# Patient Record
Sex: Female | Born: 1975 | Race: Black or African American | Hispanic: No | Smoking: Never smoker
Health system: Southern US, Community
[De-identification: ages and names within clinical notes are randomized; demographics above are authoritative.]

## PROBLEM LIST (undated history)

## (undated) ENCOUNTER — Emergency Department (HOSPITAL_COMMUNITY): Admission: EM | Payer: Federal, State, Local not specified - PPO

## (undated) ENCOUNTER — Ambulatory Visit: Disposition: A | Discharge status: Home / Self Care | Payer: Federal, State, Local not specified - PPO

## (undated) DIAGNOSIS — K5792 Diverticulitis of intestine, part unspecified, without perforation or abscess without bleeding: Secondary | ICD-10-CM

## (undated) DIAGNOSIS — K5900 Constipation, unspecified: Secondary | ICD-10-CM

## (undated) DIAGNOSIS — R7303 Prediabetes: Secondary | ICD-10-CM

## (undated) DIAGNOSIS — E669 Obesity, unspecified: Secondary | ICD-10-CM

## (undated) DIAGNOSIS — G4733 Obstructive sleep apnea (adult) (pediatric): Secondary | ICD-10-CM

## (undated) DIAGNOSIS — K219 Gastro-esophageal reflux disease without esophagitis: Secondary | ICD-10-CM

## (undated) DIAGNOSIS — G9332 Myalgic encephalomyelitis/chronic fatigue syndrome: Secondary | ICD-10-CM

## (undated) DIAGNOSIS — E559 Vitamin D deficiency, unspecified: Secondary | ICD-10-CM

## (undated) DIAGNOSIS — K829 Disease of gallbladder, unspecified: Secondary | ICD-10-CM

## (undated) DIAGNOSIS — I10 Essential (primary) hypertension: Secondary | ICD-10-CM

## (undated) DIAGNOSIS — H6120 Impacted cerumen, unspecified ear: Secondary | ICD-10-CM

## (undated) DIAGNOSIS — K589 Irritable bowel syndrome without diarrhea: Secondary | ICD-10-CM

## (undated) HISTORY — DX: Prediabetes: R73.03

## (undated) HISTORY — DX: Impacted cerumen, unspecified ear: H61.20

## (undated) HISTORY — DX: Irritable bowel syndrome, unspecified: K58.9

## (undated) HISTORY — DX: Disease of gallbladder, unspecified: K82.9

## (undated) HISTORY — DX: Essential (primary) hypertension: I10

## (undated) HISTORY — DX: Obesity, unspecified: E66.9

## (undated) HISTORY — PX: ABDOMINAL HYSTERECTOMY: SHX81

## (undated) HISTORY — PX: TUBAL LIGATION: SHX77

## (undated) HISTORY — DX: Diverticulitis of intestine, part unspecified, without perforation or abscess without bleeding: K57.92

## (undated) HISTORY — DX: Gastro-esophageal reflux disease without esophagitis: K21.9

## (undated) HISTORY — DX: Obstructive sleep apnea (adult) (pediatric): G47.33

## (undated) HISTORY — DX: Myalgic encephalomyelitis/chronic fatigue syndrome: G93.32

## (undated) HISTORY — DX: Vitamin D deficiency, unspecified: E55.9

## (undated) HISTORY — PX: ABLATION ON ENDOMETRIOSIS: SHX5787

## (undated) HISTORY — DX: Constipation, unspecified: K59.00

## (undated) HISTORY — PX: CHOLECYSTECTOMY: SHX55

## (undated) HISTORY — PX: WISDOM TOOTH EXTRACTION: SHX21

---
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
## 2014-04-26 DIAGNOSIS — G4733 Obstructive sleep apnea (adult) (pediatric): Secondary | ICD-10-CM

## 2014-04-26 HISTORY — DX: Obstructive sleep apnea (adult) (pediatric): G47.33

## 2014-10-15 DIAGNOSIS — H6120 Impacted cerumen, unspecified ear: Secondary | ICD-10-CM | POA: Insufficient documentation

## 2014-10-15 DIAGNOSIS — J029 Acute pharyngitis, unspecified: Secondary | ICD-10-CM | POA: Insufficient documentation

## 2015-05-09 DIAGNOSIS — R112 Nausea with vomiting, unspecified: Secondary | ICD-10-CM | POA: Insufficient documentation

## 2015-05-09 DIAGNOSIS — R5383 Other fatigue: Secondary | ICD-10-CM | POA: Insufficient documentation

## 2015-05-09 DIAGNOSIS — I10 Essential (primary) hypertension: Secondary | ICD-10-CM | POA: Insufficient documentation

## 2015-05-09 HISTORY — DX: Essential (primary) hypertension: I10

## 2015-11-21 HISTORY — PX: LAPAROSCOPIC HYSTERECTOMY: SHX1926

## 2017-04-01 DIAGNOSIS — M25512 Pain in left shoulder: Secondary | ICD-10-CM | POA: Diagnosis not present

## 2017-06-28 DIAGNOSIS — G4733 Obstructive sleep apnea (adult) (pediatric): Secondary | ICD-10-CM | POA: Diagnosis not present

## 2017-07-16 DIAGNOSIS — G4733 Obstructive sleep apnea (adult) (pediatric): Secondary | ICD-10-CM | POA: Diagnosis not present

## 2017-08-16 DIAGNOSIS — G4733 Obstructive sleep apnea (adult) (pediatric): Secondary | ICD-10-CM | POA: Diagnosis not present

## 2017-08-25 ENCOUNTER — Encounter: Payer: Self-pay | Admitting: Obstetrics & Gynecology

## 2017-08-25 ENCOUNTER — Ambulatory Visit (INDEPENDENT_AMBULATORY_CARE_PROVIDER_SITE_OTHER): Payer: Federal, State, Local not specified - PPO | Admitting: Obstetrics & Gynecology

## 2017-08-25 ENCOUNTER — Encounter: Payer: Self-pay | Admitting: Radiology

## 2017-08-25 VITALS — BP 155/90 | HR 85 | Ht 65.0 in | Wt 211.0 lb

## 2017-08-25 DIAGNOSIS — K589 Irritable bowel syndrome without diarrhea: Secondary | ICD-10-CM | POA: Insufficient documentation

## 2017-08-25 DIAGNOSIS — Z01419 Encounter for gynecological examination (general) (routine) without abnormal findings: Secondary | ICD-10-CM | POA: Diagnosis not present

## 2017-08-25 DIAGNOSIS — K580 Irritable bowel syndrome with diarrhea: Secondary | ICD-10-CM

## 2017-08-25 DIAGNOSIS — I10 Essential (primary) hypertension: Secondary | ICD-10-CM

## 2017-08-25 DIAGNOSIS — Z90710 Acquired absence of both cervix and uterus: Secondary | ICD-10-CM

## 2017-08-25 DIAGNOSIS — G4733 Obstructive sleep apnea (adult) (pediatric): Secondary | ICD-10-CM

## 2017-08-25 NOTE — Patient Instructions (Signed)
Return to clinic for any scheduled appointments or for any gynecologic concerns as needed.   

## 2017-08-25 NOTE — Progress Notes (Signed)
GYNECOLOGY ANNUAL PREVENTATIVE CARE ENCOUNTER NOTE  Subjective:   Diane Townsend is a 42 y.o. G19P2002 female here for a routine annual gynecologic exam and to establish care. Had laparoscopic hysterectomy for AUB in 2017.  Current complaints: none.  Desires healthcare maintenance labs, and a new PCP for her medical issues.   Denies abnormal vaginal bleeding, discharge, pelvic pain, problems with intercourse or other gynecologic concerns.    Gynecologic History No LMP recorded. Patient has had a hysterectomy. Contraception: status post hysterectomy Last mammogram: 2-3 years ago. Results were: normal  Obstetric History OB History  Gravida Para Term Preterm AB Living  2 2 2  0 0 2  SAB TAB Ectopic Multiple Live Births  0 0 0 0 2    # Outcome Date GA Lbr Len/2nd Weight Sex Delivery Anes PTL Lv  2 Term      Vag-Spont   LIV  1 Term      Vag-Spont   LIV      Past Medical History:  Diagnosis Date  . Hypertensive disorder 05/09/2015  . IBS (irritable bowel syndrome)   . Obstructive sleep apnea syndrome 04/26/2014    Past Surgical History:  Procedure Laterality Date  . LAPAROSCOPIC HYSTERECTOMY  11/21/2015   Done for AUB, benign pathology. Ovaries still in place.    Current Outpatient Medications on File Prior to Visit  Medication Sig Dispense Refill  . Cholecalciferol (VITAMIN D3 PO) Vitamin D3    . Docusate Sodium (COLACE PO) Colace    . ibuprofen (ADVIL,MOTRIN) 200 MG tablet ibuprofen 200 mg tablet  Take 1 tablet every 6 hours by oral route.    Marland Kitchen LINZESS 72 MCG capsule TK ONE C PO  QD  0  . Multiple Vitamins-Minerals (MULTIVITAMIN ADULT PO) Vitamin-Mineral Supplement oral liquid     No current facility-administered medications on file prior to visit.     No Known Allergies  Social History   Socioeconomic History  . Marital status: Married    Spouse name: Not on file  . Number of children: Not on file  . Years of education: Not on file  . Highest education level:  Not on file  Social Needs  . Financial resource strain: Not on file  . Food insecurity - worry: Not on file  . Food insecurity - inability: Not on file  . Transportation needs - medical: Not on file  . Transportation needs - non-medical: Not on file  Occupational History  . 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 Smoking status: Never Smoker  . Smokeless tobacco: Never Used  Substance and Sexual Activity  . Alcohol use: No    Frequency: Never  . Drug use: No  . Sexual activity: Yes    Partners: Male    Birth control/protection: Surgical  Other Topics Concern  . Not on file  Social History Narrative  . Not on file    Family History  Problem Relation Age of Onset  . Ovarian cancer Maternal Grandmother   . Diabetes Paternal Grandmother   . Lung cancer Paternal Grandfather     The following portions of the patient's history were reviewed and updated as appropriate: allergies, current medications, past family history, past medical history, past social history, past surgical history and problem list.  Review of Systems Pertinent items noted in HPI and remainder of comprehensive ROS otherwise negative.   Objective:  BP (!) 155/90   Pulse 85   Ht  5\' 5"  (1.651 m)   Wt 211 lb (95.7 kg)   BMI 35.11 kg/m  CONSTITUTIONAL: Well-developed, well-nourished female in no acute distress.  HENT:  Normocephalic, atraumatic, External right and left ear normal. Oropharynx is clear and moist EYES: Conjunctivae and EOM are normal. Pupils are equal, round, and reactive to light. No scleral icterus.  NECK: Normal range of motion, supple, no masses.  Normal thyroid.  SKIN: Skin is warm and dry. No rash noted. Not diaphoretic. No erythema. No pallor. NEUROLOGIC: Alert and oriented to person, place, and time. Normal reflexes, muscle tone coordination. No cranial nerve deficit noted. PSYCHIATRIC: Normal mood and affect. Normal  behavior. Normal judgment and thought content. CARDIOVASCULAR: Normal heart rate noted, regular rhythm RESPIRATORY: Clear to auscultation bilaterally. Effort and breath sounds normal, no problems with respiration noted. BREASTS: Symmetric in size. No masses, skin changes, nipple drainage, or lymphadenopathy. ABDOMEN: Soft, normal bowel sounds, no distention noted.  No tenderness, rebound or guarding.  PELVIC: Normal appearing external genitalia; normal appearing vaginal mucosa and cuff.   No abnormal discharge noted.  Unable to palpate adnexa. MUSCULOSKELETAL: Normal range of motion. No tenderness.  No cyanosis, clubbing, or edema.  2+ distal pulses.   Assessment and Plan:  1. Essential hypertension 2. Irritable bowel syndrome with diarrhea 3. Obstructive sleep apnea syndrome Needs a new PCP - [facility]  4. Well woman exam with routine gynecological exam 5. S/P laparoscopic hysterectomy - MM SCREENING BREAST TOMO BILATERAL; Future - CBC - TSH - Hemoglobin A1c - Comprehensive metabolic panel - Lipid panel - Ambulatory referral to Family Practice - VITAMIN D 25 Hydroxy (Vit-D Deficiency, Fractures) No pap needed given absence of cervix and no history of cervical dysplasia or other malignancy. Mammogram scheduled Healthcare maintenance labs to be drawn Routine preventative health maintenance measures emphasized.     Diane Schneiders, MD, [place] for [employer], [place]

## 2017-08-26 ENCOUNTER — Other Ambulatory Visit: Payer: Federal, State, Local not specified - PPO

## 2017-08-26 DIAGNOSIS — Z01419 Encounter for gynecological examination (general) (routine) without abnormal findings: Secondary | ICD-10-CM | POA: Diagnosis not present

## 2017-08-26 DIAGNOSIS — K080 Exfoliation of teeth due to systemic causes: Secondary | ICD-10-CM | POA: Diagnosis not present

## 2017-08-27 ENCOUNTER — Telehealth: Payer: Self-pay

## 2017-08-27 DIAGNOSIS — K580 Irritable bowel syndrome with diarrhea: Secondary | ICD-10-CM

## 2017-08-27 LAB — CBC
Hematocrit: 39.5 % (ref 34.0–46.6)
Hemoglobin: 12.9 g/dL (ref 11.1–15.9)
MCH: 26.8 pg (ref 26.6–33.0)
MCHC: 32.7 g/dL (ref 31.5–35.7)
MCV: 82 fL (ref 79–97)
PLATELETS: 278 10*3/uL (ref 150–379)
RBC: 4.82 x10E6/uL (ref 3.77–5.28)
RDW: 14.7 % (ref 12.3–15.4)
WBC: 4.3 10*3/uL (ref 3.4–10.8)

## 2017-08-27 LAB — TSH: TSH: 1.91 u[IU]/mL (ref 0.450–4.500)

## 2017-08-27 LAB — COMPREHENSIVE METABOLIC PANEL
ALBUMIN: 4.4 g/dL (ref 3.5–5.5)
ALT: 28 IU/L (ref 0–32)
AST: 25 IU/L (ref 0–40)
Albumin/Globulin Ratio: 1.6 (ref 1.2–2.2)
Alkaline Phosphatase: 37 IU/L — ABNORMAL LOW (ref 39–117)
BILIRUBIN TOTAL: 0.2 mg/dL (ref 0.0–1.2)
BUN / CREAT RATIO: 18 (ref 9–23)
BUN: 14 mg/dL (ref 6–24)
CO2: 22 mmol/L (ref 20–29)
CREATININE: 0.79 mg/dL (ref 0.57–1.00)
Calcium: 9.3 mg/dL (ref 8.7–10.2)
Chloride: 105 mmol/L (ref 96–106)
GFR, EST AFRICAN AMERICAN: 107 mL/min/{1.73_m2} (ref >59)
GFR, EST NON AFRICAN AMERICAN: 93 mL/min/{1.73_m2} (ref >59)
GLUCOSE: 97 mg/dL (ref 65–99)
Globulin, Total: 2.8 g/dL (ref 1.5–4.5)
Potassium: 4.3 mmol/L (ref 3.5–5.2)
Sodium: 142 mmol/L (ref 134–144)
TOTAL PROTEIN: 7.2 g/dL (ref 6.0–8.5)

## 2017-08-27 LAB — LIPID PANEL
Chol/HDL Ratio: 3.1 ratio (ref 0.0–4.4)
Cholesterol, Total: 138 mg/dL (ref 100–199)
HDL: 44 mg/dL (ref >39)
LDL Calculated: 57 mg/dL (ref 0–99)
TRIGLYCERIDES: 185 mg/dL — AB (ref 0–149)
VLDL Cholesterol Cal: 37 mg/dL (ref 5–40)

## 2017-08-27 LAB — HEMOGLOBIN A1C
Est. average glucose Bld gHb Est-mCnc: 117 mg/dL
Hgb A1c MFr Bld: 5.7 % — ABNORMAL HIGH (ref 4.8–5.6)

## 2017-08-27 LAB — VITAMIN D 25 HYDROXY (VIT D DEFICIENCY, FRACTURES): Vit D, 25-Hydroxy: 36.9 ng/mL (ref 30.0–100.0)

## 2017-08-27 NOTE — Telephone Encounter (Signed)
Patient is requesting a refill on Linzess until she can get in with primary care. Can we refill?

## 2017-08-27 NOTE — Telephone Encounter (Signed)
-----   Message from Blanchie Dessert, [place] sent at 08/27/2017  9:55 AM EST ----- Regarding: rx request  Contact: [phone] Please call patient has question about rx request- Diane Townsend

## 2017-08-30 ENCOUNTER — Other Ambulatory Visit: Payer: Self-pay

## 2017-08-30 MED ORDER — LINZESS 72 MCG PO CAPS: ORAL_CAPSULE | ORAL | 1 refills | Status: DC

## 2017-08-30 NOTE — Telephone Encounter (Signed)
Linzess refilled for patient as requested. Needs to follow up with PCP or GI specialist.

## 2017-08-30 NOTE — Telephone Encounter (Signed)
Linzess has been sen to the pharmacy

## 2017-08-30 NOTE — Addendum Note (Signed)
Addended by: [name] A on: 08/30/2017 08:32 AM   Modules accepted: Orders

## 2017-09-10 ENCOUNTER — Ambulatory Visit: Payer: Federal, State, Local not specified - PPO | Admitting: Family Medicine

## 2017-09-10 ENCOUNTER — Encounter: Payer: Self-pay | Admitting: Family Medicine

## 2017-09-10 VITALS — BP 134/84 | HR 89 | Temp 98.6°F | Ht 64.0 in | Wt 213.5 lb

## 2017-09-10 DIAGNOSIS — Z7689 Persons encountering health services in other specified circumstances: Secondary | ICD-10-CM | POA: Diagnosis not present

## 2017-09-10 DIAGNOSIS — F339 Major depressive disorder, recurrent, unspecified: Secondary | ICD-10-CM

## 2017-09-10 DIAGNOSIS — K5900 Constipation, unspecified: Secondary | ICD-10-CM | POA: Diagnosis not present

## 2017-09-10 DIAGNOSIS — R7303 Prediabetes: Secondary | ICD-10-CM

## 2017-09-10 DIAGNOSIS — E669 Obesity, unspecified: Secondary | ICD-10-CM | POA: Diagnosis not present

## 2017-09-10 MED ORDER — LINZESS 72 MCG PO CAPS: ORAL_CAPSULE | ORAL | 3 refills | Status: DC

## 2017-09-10 MED ORDER — BUPROPION HCL ER (SR) 150 MG PO TB12: ORAL_TABLET | ORAL | 5 refills | Status: DC

## 2017-09-10 NOTE — Progress Notes (Signed)
Subjective:    Patient ID: Diane Townsend, female    DOB: 09/17/1975, 42 y.o.   MRN: [id number]  HPI This is a 42 yo female who presents today to establish care. She is married. Works from home for the [place] [employer]. Her husband had a stroke last year (sees Allie Bossier). Has 25, 84 yo daughters, one in [place] and one in [place].   She has long standing history of constipation. She noticed this after having her gallbladder removed. Takes Linzess with good results. Has not seen GI. Has BM daily if she takes Linzess.   Has been feeling mentally stressed. Has been on Ritalin in past. She lost a husband to cancer years ago. Does not tolerate SSRI, makes her feel terrible. Thinks she has tried WelButrin in past.   Uses CPAP nightly, was diagnosed with OSA in 2008, sleeps well with machine. Uses nose pillow. Feels rested. Sleeps 8-9 hours a night.   She had labs drawn 2 weeks ago. Was found to have elevated triglycerides and hemoglobin A1C 5.7 (prediabetes).    Last CPE- sees gyn Mammo- having next week Pap- hysterectomy Colonoscopy- never Tdap- unknown Flu- annually, 06/02/17 Eye- several years Dental- regular Exercise- walks  She does cooking and shopping. Breakfast- boiled eggs/bacon, Lunch- sandwich- white, ham/turkey/lettuce, chips, or salad, Dinner- chicken, baked meat, vegetables, tries to limit starches. No soda. Drinks 4 cups lemonade daily and water with lemon.   Past Medical History:  Diagnosis Date  . Hypertensive disorder 05/09/2015  . IBS (irritable bowel syndrome)   . Obstructive sleep apnea syndrome 04/26/2014   Past Surgical History:  Procedure Laterality Date  . ABLATION ON ENDOMETRIOSIS    . LAPAROSCOPIC HYSTERECTOMY  11/21/2015   Done for AUB, benign pathology. Ovaries still in place.  . TUBAL LIGATION    . WISDOM TOOTH EXTRACTION     Family History  Problem Relation Age of Onset  . Ovarian cancer Maternal Grandmother   . Diabetes Paternal Grandmother   . Lung cancer  Paternal Grandfather   . Arthritis Paternal Grandfather   . Hypertension Father   . Hyperlipidemia Father    Social History   Tobacco Use  . Smoking status: Never Smoker  . Smokeless tobacco: Never Used  Substance Use Topics  . Alcohol use: No    Frequency: Never  . Drug use: No      Review of Systems  Constitutional: Positive for fatigue.  Respiratory: Negative for cough and shortness of breath.   Cardiovascular: Negative for chest pain, palpitations and leg swelling.  Gastrointestinal: Positive for abdominal distention (with constipation) and constipation. Negative for blood in stool.  Genitourinary: Negative for dysuria.  Psychiatric/Behavioral: Positive for dysphoric mood. Negative for sleep disturbance. The patient is not nervous/anxious.        Objective:   Physical Exam Physical Exam  Constitutional: Oriented to person, place, and time. She appears well-developed and well-nourished. Obese.  HENT:  Head: Normocephalic and atraumatic.  Eyes: Conjunctivae are normal.  Neck: Normal range of motion. Neck supple.  Cardiovascular: Normal rate, regular rhythm and normal heart sounds.   Pulmonary/Chest: Effort normal and breath sounds normal.  Musculoskeletal: Normal range of motion.  Neurological: Alert and oriented to person, place, and time.  Skin: Skin is warm and dry.  Psychiatric: Normal mood and affect. Behavior is normal. Judgment and thought content normal.  Vitals reviewed.     BP (!) 158/82   Pulse 89   Temp 98.6 F (37 C) (Oral)   Ht 5'  4" (1.626 m)   Wt 213 lb 8 oz (96.8 kg)   SpO2 97%   BMI 36.65 kg/m  Wt Readings from Last 3 Encounters:  09/10/17 213 lb 8 oz (96.8 kg)  08/25/17 211 lb (95.7 kg)   Depression screen [facility] 2/9 09/10/2017  Decreased Interest 0  Down, Depressed, Hopeless 0  PHQ - 2 Score 0   BP Readings from Last 3 Encounters:  09/10/17 134/84  08/25/17 (!) 155/90   Depression screen PHQ 2/9 09/10/2017  Decreased Interest 0    Down, Depressed, Hopeless 0  PHQ - 2 Score 0       Assessment & Plan:  1. Encounter to establish care - Discussed and encouraged healthy lifestyle choices- adequate sleep, regular exercise, stress management and healthy food choices.   2. Constipation, unspecified constipation type - LINZESS 72 MCG capsule; Take one capsule by mouth daily  Dispense: 90 capsule; Refill: 3 - encouraged 25-30 gms fiber daily  3. Obesity (BMI 30-39.9) - discussed her current diet and provided written and verbal information regarding the Mediterranean Diet and encouraged her to eliminate calorie containing beverages.  - weight loss goal of 5 pounds over next 2 months - discussed increasing exercise   4. Depression, recurrent (HCC) - buPROPion (WELLBUTRIN SR) 150 MG 12 hr tablet; Take one tablet in am x 4 days, then increase to one tablet twice a day  Dispense: 60 tablet; Refill: 5  5. Prediabetes - see #3  - follow up in 8-12 weeks  Clarene Reamer, FNP-BC  [facility] at [facility], [facility]  09/10/2017 5:25 PM

## 2017-09-10 NOTE — Patient Instructions (Addendum)
It was a pleasure to meet you today! I look forward to partnering with you for your health care needs   Schedule an eye exam for a check up  Www.fatlossfoodies.com  Follow up in 8 weeks   [place] refers to food and lifestyle choices that are based on the traditions of countries located on the [employer]. This way of eating has been shown to help prevent certain conditions and improve outcomes for people who have chronic diseases, like kidney disease and heart disease. What are tips for following this plan? Lifestyle  Cook and eat meals together with your family, when possible.  Drink enough fluid to keep your urine clear or pale yellow.  Be physically active every day. This includes: ? Aerobic exercise like running or swimming. ? Leisure activities like gardening, walking, or housework.  Get 7-8 hours of sleep each night.  If recommended by your health care provider, drink red wine in moderation. This means 1 glass a day for nonpregnant women and 2 glasses a day for men. A glass of wine equals 5 oz (150 mL). Reading food labels  Check the serving size of packaged foods. For foods such as rice and pasta, the serving size refers to the amount of cooked product, not dry.  Check the total fat in packaged foods. Avoid foods that have saturated fat or trans fats.  Check the ingredients list for added sugars, such as corn syrup. Shopping  At the grocery store, buy most of your food from the areas near the walls of the store. This includes: ? Fresh fruits and vegetables (produce). ? Grains, beans, nuts, and seeds. Some of these may be available in unpackaged forms or large amounts (in bulk). ? Fresh seafood. ? Poultry and eggs. ? Low-fat dairy products.  Buy whole ingredients instead of prepackaged foods.  Buy fresh fruits and vegetables in-season from local farmers markets.  Buy frozen fruits and vegetables in resealable bags.  If you do  not have access to quality fresh seafood, buy precooked frozen shrimp or canned fish, such as tuna, salmon, or sardines.  Buy small amounts of raw or cooked vegetables, salads, or olives from the deli or salad bar at your store.  Stock your pantry so you always have certain foods on hand, such as olive oil, canned tuna, canned tomatoes, rice, pasta, and beans. Cooking  Cook foods with extra-virgin olive oil instead of using butter or other vegetable oils.  Have meat as a side dish, and have vegetables or grains as your main dish. This means having meat in small portions or adding small amounts of meat to foods like pasta or stew.  Use beans or vegetables instead of meat in common dishes like chili or lasagna.  Experiment with different cooking methods. Try roasting or broiling vegetables instead of steaming or sauteing them.  Add frozen vegetables to soups, stews, pasta, or rice.  Add nuts or seeds for added healthy fat at each meal. You can add these to yogurt, salads, or vegetable dishes.  Marinate fish or vegetables using olive oil, lemon juice, garlic, and fresh herbs. Meal planning  Plan to eat 1 vegetarian meal one day each week. Try to work up to 2 vegetarian meals, if possible.  Eat seafood 2 or more times a week.  Have healthy snacks readily available, such as: ? Vegetable sticks with hummus. ? [place] yogurt. ? Fruit and nut trail mix.  Eat balanced meals throughout the week. This includes: ?  Fruit: 2-3 servings a day ? Vegetables: 4-5 servings a day ? Low-fat dairy: 2 servings a day ? Fish, poultry, or lean meat: 1 serving a day ? Beans and legumes: 2 or more servings a week ? Nuts and seeds: 1-2 servings a day ? Whole grains: 6-8 servings a day ? Extra-virgin olive oil: 3-4 servings a day  Limit red meat and sweets to only a few servings a month What are my food choices?  Mediterranean diet ? Recommended ? Grains: Whole-grain pasta. Brown rice. Bulgar wheat.  Polenta. Couscous. Whole-wheat bread. [place]. ? Vegetables: Artichokes. Beets. Broccoli. Cabbage. Carrots. Eggplant. Green beans. Chard. Kale. Spinach. Onions. Leeks. Peas. Squash. Tomatoes. Peppers. Radishes. ? Fruits: Apples. Apricots. Avocado. Berries. Bananas. Cherries. Dates. Figs. Grapes. Lemons. Melon. Oranges. Peaches. Plums. Pomegranate. ? Meats and other protein foods: Beans. Almonds. Sunflower seeds. Pine nuts. Peanuts. [place]. Salmon. Scallops. Shrimp. [place]. Tilapia. Clams. Oysters. Eggs. ? Dairy: Low-fat milk. Cheese. Greek yogurt. ? Beverages: Water. Red wine. Herbal tea. ? Fats and oils: Extra virgin olive oil. Avocado oil. Grape seed oil. ? Sweets and desserts: [place] yogurt with honey. Baked apples. Poached pears. Trail mix. ? Seasoning and other foods: Basil. Cilantro. Coriander. Cumin. Mint. Parsley. Sage. Rosemary. Tarragon. Garlic. Oregano. Thyme. Pepper. Balsalmic vinegar. Tahini. Hummus. Tomato sauce. Olives. Mushrooms. ? Limit these ? Grains: Prepackaged pasta or rice dishes. Prepackaged cereal with added sugar. ? Vegetables: Deep fried potatoes (french fries). ? Fruits: Fruit canned in syrup. ? Meats and other protein foods: Beef. Pork. Lamb. Poultry with skin. Hot dogs. Bernie[place] Salines. ? Dairy: Ice cream. Sour cream. Whole milk. ? Beverages: Juice. Sugar-sweetened soft drinks. Beer. Liquor and spirits. ? Fats and oils: Butter. Canola oil. Vegetable oil. Beef fat (tallow). Lard. ? Sweets and desserts: Cookies. Cakes. Pies. Candy. ? Seasoning and other foods: Mayonnaise. Premade sauces and marinades. ? The items listed may not be a complete list. Talk with your dietitian about what dietary choices are right for you. Summary  The Mediterranean diet includes both food and lifestyle choices.  Eat a variety of fresh fruits and vegetables, beans, nuts, seeds, and whole grains.  Limit the amount of red meat and sweets that you eat.  Talk with your health care provider about  whether it is safe for you to drink red wine in moderation. This means 1 glass a day for nonpregnant women and 2 glasses a day for men. A glass of wine equals 5 oz (150 mL). This information is not intended to replace advice given to you by your health care provider. Make sure you discuss any questions you have with your health care provider. Document Released: 02/27/2016 Document Revised: 03/31/2016 Document Reviewed: 02/27/2016 Elsevier Interactive Patient Education  [employer].

## 2017-09-14 ENCOUNTER — Telehealth: Payer: Self-pay | Admitting: *Deleted

## 2017-09-14 ENCOUNTER — Ambulatory Visit
Admission: RE | Admit: 2017-09-14 | Discharge: 2017-09-14 | Disposition: A | Discharge status: Home / Self Care | Payer: Federal, State, Local not specified - PPO | Source: Ambulatory Visit | Attending: Obstetrics & Gynecology | Admitting: Obstetrics & Gynecology

## 2017-09-14 DIAGNOSIS — Z01419 Encounter for gynecological examination (general) (routine) without abnormal findings: Secondary | ICD-10-CM

## 2017-09-14 DIAGNOSIS — Z1231 Encounter for screening mammogram for malignant neoplasm of breast: Secondary | ICD-10-CM | POA: Insufficient documentation

## 2017-09-14 NOTE — Telephone Encounter (Signed)
Patient requested medical records from [facility] to be sent [facility]. Request sent via-fax.

## 2017-09-15 NOTE — Telephone Encounter (Signed)
Records were received 08/25/17.

## 2017-09-16 DIAGNOSIS — G4733 Obstructive sleep apnea (adult) (pediatric): Secondary | ICD-10-CM | POA: Diagnosis not present

## 2017-09-22 ENCOUNTER — Inpatient Hospital Stay
Admission: RE | Admit: 2017-09-22 | Discharge: 2017-09-22 | Disposition: A | Discharge status: Home / Self Care | Payer: Self-pay | Source: Ambulatory Visit | Attending: *Deleted | Admitting: *Deleted

## 2017-09-22 ENCOUNTER — Other Ambulatory Visit: Payer: Self-pay | Admitting: *Deleted

## 2017-09-22 DIAGNOSIS — Z9289 Personal history of other medical treatment: Secondary | ICD-10-CM

## 2017-10-13 DIAGNOSIS — G4733 Obstructive sleep apnea (adult) (pediatric): Secondary | ICD-10-CM | POA: Diagnosis not present

## 2017-10-14 DIAGNOSIS — G4733 Obstructive sleep apnea (adult) (pediatric): Secondary | ICD-10-CM | POA: Diagnosis not present

## 2017-10-19 DIAGNOSIS — K080 Exfoliation of teeth due to systemic causes: Secondary | ICD-10-CM | POA: Diagnosis not present

## 2017-11-17 ENCOUNTER — Encounter: Payer: Self-pay | Admitting: Family Medicine

## 2017-11-17 ENCOUNTER — Ambulatory Visit: Payer: Federal, State, Local not specified - PPO | Admitting: Family Medicine

## 2017-11-17 VITALS — BP 150/98 | HR 94 | Temp 97.3°F | Ht 64.0 in | Wt 209.8 lb

## 2017-11-17 DIAGNOSIS — E669 Obesity, unspecified: Secondary | ICD-10-CM | POA: Diagnosis not present

## 2017-11-17 DIAGNOSIS — F339 Major depressive disorder, recurrent, unspecified: Secondary | ICD-10-CM

## 2017-11-17 DIAGNOSIS — K5900 Constipation, unspecified: Secondary | ICD-10-CM

## 2017-11-17 NOTE — Patient Instructions (Signed)
Good to see you today!  Please follow up in 6 months, sooner if needed

## 2017-11-17 NOTE — Progress Notes (Signed)
   Subjective:    Patient ID: Diane Townsend, female    DOB: 12-31-1975, 42 y.o.   MRN: [id number]  HPI This is a 42 yo female who presents today for follow up of depression, constipation and obesity.   Depression- reports that she is doing well. Her husband continues to recover from his CVA and is waiting on disability decision. She is graetful that she has a job working from home. She had a grandson born 73 months ago and her daughter in [place] is expecting in October and will be moving to [place] to have the baby.   Constipation- having daily BMs.  Obesity- has lost several pounds, trying to work on making healthy food choices. Finds that she makes unhealthy choices when she is stressed.   Past Medical History:  Diagnosis Date  . Hypertensive disorder 05/09/2015  . IBS (irritable bowel syndrome)   . Obstructive sleep apnea syndrome 04/26/2014   Past Surgical History:  Procedure Laterality Date  . ABLATION ON ENDOMETRIOSIS    . APPENDECTOMY  1992  . CHOLECYSTECTOMY  1992  . LAPAROSCOPIC HYSTERECTOMY  11/21/2015   Done for AUB, benign pathology. Ovaries still in place.  . TUBAL LIGATION    . WISDOM TOOTH EXTRACTION     Family History  Problem Relation Age of Onset  . Ovarian cancer Maternal Grandmother   . Diabetes Paternal Grandmother   . Lung cancer Paternal Grandfather   . Arthritis Paternal Grandfather   . Hypertension Father   . Hyperlipidemia Father    Social History   Tobacco Use  . Smoking status: Never Smoker  . Smokeless tobacco: Never Used  Substance Use Topics  . Alcohol use: No    Frequency: Never  . Drug use: No      Review of Systems     Objective:   Physical Exam  Physical Exam  Constitutional: Oriented to person, place, and time. She appears well-developed and well-nourished.  HENT:  Head: Normocephalic and atraumatic.  Eyes: Conjunctivae are normal.  Neck: Normal range of motion. Neck supple.  Cardiovascular: Normal rate, regular rhythm and  normal heart sounds.   Pulmonary/Chest: Effort normal and breath sounds normal.  Musculoskeletal: Normal range of motion.  Neurological: Alert and oriented to person, place, and time.  Skin: Skin is warm and dry.  Psychiatric: Normal mood and affect. Behavior is normal. Judgment and thought content normal.  Vitals reviewed.    BP (!) 150/98   Pulse 94   Temp (!) 97.3 F (36.3 C) (Oral)   Ht 5\' 4"  (1.626 m)   Wt 209 lb 12 oz (95.1 kg)   SpO2 98%   BMI 36.00 kg/m   Wt Readings from Last 3 Encounters:  11/17/17 209 lb 12 oz (95.1 kg)  09/10/17 213 lb 8 oz (96.8 kg)  08/25/17 211 lb (95.7 kg)       Assessment & Plan:  1. Depression, recurrent ([place]) - continue bupropion - encouraged self care measures- exercise, sleep, healthy food choices  2. Constipation, unspecified constipation type - doing well on current meds  3. Obesity (BMI 30-39.9) - discussed increasing fiber in diet, decreasing unhealthy temptations, regular walking  - follow up in 6 months, will check labs at that time    Clarene Reamer, FNP-BC  [facility] at [facility], [place]  11/18/2017 9:04 PM

## 2017-11-18 ENCOUNTER — Encounter: Payer: Self-pay | Admitting: Family Medicine

## 2017-11-22 ENCOUNTER — Encounter: Payer: Self-pay | Admitting: Family Medicine

## 2018-01-19 ENCOUNTER — Telehealth: Payer: Self-pay | Admitting: *Deleted

## 2018-01-19 ENCOUNTER — Other Ambulatory Visit: Payer: Self-pay | Admitting: Family Medicine

## 2018-01-19 NOTE — Telephone Encounter (Signed)
Copied from [place] [id number]. Topic: Quick Communication - Rx Refill/Question >> Jan 19, 2018 11:11 AM Judyann Munson wrote: Medication: LINZESS 72 MCG capsule  Has the patient contacted their pharmacy? No  Preferred Pharmacy (with phone number or street name): [id number] - [place], [place] [phone] (Phone) [phone] (Fax)   Patient requested a 90 day supply Please advise

## 2018-01-19 NOTE — Telephone Encounter (Signed)
I spoke with Colletta [place] at [facility] [place] and they do have refills on file for pt but are presently out of [place] and will be in on 01/21/18. Pt notified and voiced understanding and will ck with pharmacy on 01/21/18.

## 2018-01-19 NOTE — Telephone Encounter (Signed)
Copied from [place] [id number]. Topic: Quick Communication - Rx Refill/Question >> Jan 19, 2018 11:11 AM Judyann Munson wrote: Medication: LINZESS 72 MCG capsule  Has the patient contacted their pharmacy? No  Preferred Pharmacy (with phone number or street name): [id number] - [place], [place] [phone] (Phone) [phone] (Fax)   Patient requested a 90 day supply Please advise

## 2018-01-31 NOTE — Telephone Encounter (Signed)
Pleas sign and close encounter when completed.

## 2018-02-23 ENCOUNTER — Encounter: Payer: Self-pay | Admitting: Family Medicine

## 2018-02-23 DIAGNOSIS — K080 Exfoliation of teeth due to systemic causes: Secondary | ICD-10-CM | POA: Diagnosis not present

## 2018-03-01 ENCOUNTER — Other Ambulatory Visit: Payer: Self-pay | Admitting: Obstetrics & Gynecology

## 2018-03-01 DIAGNOSIS — K580 Irritable bowel syndrome with diarrhea: Secondary | ICD-10-CM

## 2018-03-05 DIAGNOSIS — S29012A Strain of muscle and tendon of back wall of thorax, initial encounter: Secondary | ICD-10-CM | POA: Diagnosis not present

## 2018-04-27 DIAGNOSIS — K080 Exfoliation of teeth due to systemic causes: Secondary | ICD-10-CM | POA: Diagnosis not present

## 2018-05-23 ENCOUNTER — Ambulatory Visit: Payer: Federal, State, Local not specified - PPO | Admitting: Family Medicine

## 2018-05-23 ENCOUNTER — Encounter: Payer: Self-pay | Admitting: Family Medicine

## 2018-05-23 VITALS — BP 148/98 | HR 94 | Temp 98.2°F | Ht 64.0 in | Wt 210.0 lb

## 2018-05-23 DIAGNOSIS — G4733 Obstructive sleep apnea (adult) (pediatric): Secondary | ICD-10-CM | POA: Diagnosis not present

## 2018-05-23 DIAGNOSIS — R030 Elevated blood-pressure reading, without diagnosis of hypertension: Secondary | ICD-10-CM | POA: Diagnosis not present

## 2018-05-23 DIAGNOSIS — E669 Obesity, unspecified: Secondary | ICD-10-CM

## 2018-05-23 DIAGNOSIS — Z23 Encounter for immunization: Secondary | ICD-10-CM | POA: Diagnosis not present

## 2018-05-23 DIAGNOSIS — K5900 Constipation, unspecified: Secondary | ICD-10-CM

## 2018-05-23 MED ORDER — LINZESS 72 MCG PO CAPS: ORAL_CAPSULE | ORAL | 3 refills | Status: DC

## 2018-05-23 NOTE — Patient Instructions (Addendum)
Good to see you today  Please take your blood pressure twice a week and keep a log  Please send me your readings in 4 weeks  Please let me know if your blood pressure is consistently over 140/90  Complete physical in 6 months  How to Take Your Blood Pressure You can take your blood pressure at home with a machine. You may need to check your blood pressure at home:  To check if you have high blood pressure (hypertension).  To check your blood pressure over time.  To make sure your blood pressure medicine is working.  Supplies needed: You will need a blood pressure machine, or monitor. You can buy one at a drugstore or online. When choosing one:  Choose one with an arm cuff.  Choose one that wraps around your upper arm. Only one finger should fit between your arm and the cuff.  Do not choose one that measures your blood pressure from your wrist or finger.  Your doctor can suggest a monitor. How to prepare Avoid these things for 30 minutes before checking your blood pressure:  Drinking caffeine.  Drinking alcohol.  Eating.  Smoking.  Exercising.  Five minutes before checking your blood pressure:  Pee.  Sit in a dining chair. Avoid sitting in a soft couch or armchair.  Be quiet. Do not talk.  How to take your blood pressure Follow the instructions that came with your machine. If you have a digital blood pressure monitor, these may be the instructions: 1. Sit up straight. 2. Place your feet on the floor. Do not cross your ankles or legs. 3. Rest your left arm at the level of your heart. You may rest it on a table, desk, or chair. 4. Pull up your shirt sleeve. 5. Wrap the blood pressure cuff around the upper part of your left arm. The cuff should be 1 inch (2.5 cm) above your elbow. It is best to wrap the cuff around bare skin. 6. Fit the cuff snugly around your arm. You should be able to place only one finger between the cuff and your arm. 7. Put the cord inside  the groove of your elbow. 8. Press the power button. 9. Sit quietly while the cuff fills with air and loses air. 10. Write down the numbers on the screen. 11. Wait 2-3 minutes and then repeat steps 1-10.  What do the numbers mean? Two numbers make up your blood pressure. The first number is called systolic pressure. The second is called diastolic pressure. An example of a blood pressure reading is "120 over 80" (or 120/80). If you are an adult and do not have a medical condition, use this guide to find out if your blood pressure is normal: Normal  First number: below 120.  Second number: below 80. Elevated  First number: 120-129.  Second number: below 80. Hypertension stage 1  First number: 130-139.  Second number: 80-89. Hypertension stage 2  First number: 140 or above.  Second number: 68 or above. Your blood pressure is above normal even if only the top or bottom number is above normal. Follow these instructions at home:  Check your blood pressure as often as your doctor tells you to.  Take your monitor to your next doctor's appointment. Your doctor will: ? Make sure you are using it correctly. ? Make sure it is working right.  Make sure you understand what your blood pressure numbers should be.  Tell your doctor if your medicines are causing  side effects. Contact a doctor if:  Your blood pressure keeps being high. Get help right away if:  Your first blood pressure number is higher than 180.  Your second blood pressure number is higher than 120. This information is not intended to replace advice given to you by your health care provider. Make sure you discuss any questions you have with your health care provider. Document Released: 06/18/2008 Document Revised: 06/03/2016 Document Reviewed: 12/13/2015 Elsevier Interactive Patient Education  2018 [place] Eating Plan DASH stands for "Dietary Approaches to Stop Hypertension." The DASH eating plan is a  healthy eating plan that has been shown to reduce high blood pressure (hypertension). It may also reduce your risk for type 2 diabetes, heart disease, and stroke. The DASH eating plan may also help with weight loss. What are tips for following this plan? General guidelines  Avoid eating more than 2,300 mg (milligrams) of salt (sodium) a day. If you have hypertension, you may need to reduce your sodium intake to 1,500 mg a day.  Limit alcohol intake to no more than 1 drink a day for nonpregnant women and 2 drinks a day for men. One drink equals 12 oz of beer, 5 oz of wine, or 1 oz of hard liquor.  Work with your health care provider to maintain a healthy body weight or to lose weight. Ask what an ideal weight is for you.  Get at least 30 minutes of exercise that causes your heart to beat faster (aerobic exercise) most days of the week. Activities may include walking, swimming, or biking.  Work with your health care provider or diet and nutrition specialist (dietitian) to adjust your eating plan to your individual calorie needs. Reading food labels  Check food labels for the amount of sodium per serving. Choose foods with less than 5 percent of the Daily Value of sodium. Generally, foods with less than 300 mg of sodium per serving fit into this eating plan.  To find whole grains, look for the word "whole" as the first word in the ingredient list. Shopping  Buy products labeled as "low-sodium" or "no salt added."  Buy fresh foods. Avoid canned foods and premade or frozen meals. Cooking  Avoid adding salt when cooking. Use salt-free seasonings or herbs instead of table salt or sea salt. Check with your health care provider or pharmacist before using salt substitutes.  Do not fry foods. Cook foods using healthy methods such as baking, boiling, grilling, and broiling instead.  Cook with heart-healthy oils, such as olive, canola, soybean, or sunflower oil. Meal planning   Eat a balanced  diet that includes: ? 5 or more servings of fruits and vegetables each day. At each meal, try to fill half of your plate with fruits and vegetables. ? Up to 6-8 servings of whole grains each day. ? Less than 6 oz of lean meat, poultry, or fish each day. A 3-oz serving of meat is about the same size as a deck of cards. One egg equals 1 oz. ? 2 servings of low-fat dairy each day. ? A serving of nuts, seeds, or beans 5 times each week. ? Heart-healthy fats. Healthy fats called Omega-3 fatty acids are found in foods such as flaxseeds and coldwater fish, like sardines, salmon, and mackerel.  Limit how much you eat of the following: ? Canned or prepackaged foods. ? Food that is high in trans fat, such as fried foods. ? Food that is high in saturated fat, such as  fatty meat. ? Sweets, desserts, sugary drinks, and other foods with added sugar. ? Full-fat dairy products.  Do not salt foods before eating.  Try to eat at least 2 vegetarian meals each week.  Eat more home-cooked food and less restaurant, buffet, and fast food.  When eating at a restaurant, ask that your food be prepared with less salt or no salt, if possible. What foods are recommended? The items listed may not be a complete list. Talk with your dietitian about what dietary choices are best for you. Grains Whole-grain or whole-wheat bread. Whole-grain or whole-wheat pasta. Brown rice. [place]. Bulgur. Whole-grain and low-sodium cereals. Pita bread. Low-fat, low-sodium crackers. Whole-wheat flour tortillas. Vegetables Fresh or frozen vegetables (raw, steamed, roasted, or grilled). Low-sodium or reduced-sodium tomato and vegetable juice. Low-sodium or reduced-sodium tomato sauce and tomato paste. Low-sodium or reduced-sodium canned vegetables. Fruits All fresh, dried, or frozen fruit. Canned fruit in natural juice (without added sugar). Meat and other protein foods Skinless chicken or [place]. Ground chicken or [place]. Pork  with fat trimmed off. Fish and seafood. Egg whites. Dried beans, peas, or lentils. Unsalted nuts, nut butters, and seeds. Unsalted canned beans. Lean cuts of beef with fat trimmed off. Low-sodium, lean deli meat. Dairy Low-fat (1%) or fat-free (skim) milk. Fat-free, low-fat, or reduced-fat cheeses. Nonfat, low-sodium ricotta or cottage cheese. Low-fat or nonfat yogurt. Low-fat, low-sodium cheese. Fats and oils Soft margarine without trans fats. Vegetable oil. Low-fat, reduced-fat, or light mayonnaise and salad dressings (reduced-sodium). Canola, safflower, olive, soybean, and sunflower oils. Avocado. Seasoning and other foods Herbs. Spices. Seasoning mixes without salt. Unsalted popcorn and pretzels. Fat-free sweets. What foods are not recommended? The items listed may not be a complete list. Talk with your dietitian about what dietary choices are best for you. Grains Baked goods made with fat, such as croissants, muffins, or some breads. Dry pasta or rice meal packs. Vegetables Creamed or fried vegetables. Vegetables in a cheese sauce. Regular canned vegetables (not low-sodium or reduced-sodium). Regular canned tomato sauce and paste (not low-sodium or reduced-sodium). Regular tomato and vegetable juice (not low-sodium or reduced-sodium). Angie Fava. Olives. Fruits Canned fruit in a light or heavy syrup. Fried fruit. Fruit in cream or butter sauce. Meat and other protein foods Fatty cuts of meat. Ribs. Fried meat. Berniece Salines. Sausage. Bologna and other processed lunch meats. Salami. Fatback. Hotdogs. Bratwurst. Salted nuts and seeds. Canned beans with added salt. Canned or smoked fish. Whole eggs or egg yolks. Chicken or [place] with skin. Dairy Whole or 2% milk, cream, and half-and-half. Whole or full-fat cream cheese. Whole-fat or sweetened yogurt. Full-fat cheese. Nondairy creamers. Whipped toppings. Processed cheese and cheese spreads. Fats and oils Butter. Stick margarine. Lard. Shortening. Ghee.  Bacon fat. Tropical oils, such as coconut, palm kernel, or palm oil. Seasoning and other foods Salted popcorn and pretzels. Onion salt, garlic salt, seasoned salt, table salt, and sea salt. Worcestershire sauce. Tartar sauce. Barbecue sauce. Teriyaki sauce. Soy sauce, including reduced-sodium. Steak sauce. Canned and packaged gravies. Fish sauce. Oyster sauce. Cocktail sauce. Horseradish that you find on the shelf. Ketchup. Mustard. Meat flavorings and tenderizers. Bouillon cubes. Hot sauce and Tabasco sauce. Premade or packaged marinades. Premade or packaged taco seasonings. Relishes. Regular salad dressings. Where to find more information:  National Heart, Lung, and [place]: https://wilson-eaton.com/  American Heart Association: www.heart.org Summary  The DASH eating plan is a healthy eating plan that has been shown to reduce high blood pressure (hypertension). It may also reduce your risk for  type 2 diabetes, heart disease, and stroke.  With the DASH eating plan, you should limit salt (sodium) intake to 2,300 mg a day. If you have hypertension, you may need to reduce your sodium intake to 1,500 mg a day.  When on the DASH eating plan, aim to eat more fresh fruits and vegetables, whole grains, lean proteins, low-fat dairy, and heart-healthy fats.  Work with your health care provider or diet and nutrition specialist (dietitian) to adjust your eating plan to your individual calorie needs. This information is not intended to replace advice given to you by your health care provider. Make sure you discuss any questions you have with your health care provider. Document Released: 06/25/2011 Document Revised: 06/29/2016 Document Reviewed: 06/29/2016 Elsevier Interactive Patient Education  [employer].

## 2018-05-23 NOTE — Progress Notes (Signed)
Subjective:    Patient ID: Diane Townsend, female    DOB: [birth year], 42 y.o.   MRN: [id number]  HPI This is a 42 yo female who presents today for follow up of depression, constipation, obesity.   Depression- doing well on current medications, has let go of some things that were weighing her down. Work going well. Her daughter had a baby girl last month.   Constipation- doing well with daily Linzess, requests 90 day supply  Obesity- not exercising as much, considering going vegan. Still drinks several glasses of lemonade a day.   Elevated blood pressure today, normal at home. Has automatic cuff, runs 130s/70-80 at home  OSA on CPAP- unable to get supplies since her move. Needs pulmonary referral.    Past Medical History:  Diagnosis Date  . Hypertensive disorder 05/09/2015  . IBS (irritable bowel syndrome)   . Obstructive sleep apnea syndrome 04/26/2014   Past Surgical History:  Procedure Laterality Date  . ABLATION ON ENDOMETRIOSIS    . APPENDECTOMY  1992  . CHOLECYSTECTOMY  1992  . LAPAROSCOPIC HYSTERECTOMY  11/21/2015   Done for AUB, benign pathology. Ovaries still in place.  . TUBAL LIGATION    . WISDOM TOOTH EXTRACTION     Family History  Problem Relation Age of Onset  . Ovarian cancer Maternal Grandmother   . Diabetes Paternal Grandmother   . Lung cancer Paternal Grandfather   . Arthritis Paternal Grandfather   . Hypertension Father   . Hyperlipidemia Father    Social History   Tobacco Use  . Smoking status: Never Smoker  . Smokeless tobacco: Never Used  Substance Use Topics  . Alcohol use: No    Frequency: Never  . Drug use: No      Review of Systems     Objective:   Physical Exam Physical Exam  Constitutional: Oriented to person, place, and time. She appears well-developed and well-nourished.  HENT:  Head: Normocephalic and atraumatic.  Eyes: Conjunctivae are normal.  Neck: Normal range of motion. Neck supple.  Cardiovascular: Normal rate,  regular rhythm and normal heart sounds.   Pulmonary/Chest: Effort normal and breath sounds normal.  Musculoskeletal: Normal range of motion.  Neurological: Alert and oriented to person, place, and time.  Skin: Skin is warm and dry.  Psychiatric: Normal mood and affect. Behavior is normal. Judgment and thought content normal.  Vitals reviewed.     .BP (!) 168/100 (BP Location: Left Arm, Patient Position: Sitting, Cuff Size: Large)   Pulse 94   Temp 98.2 F (36.8 C) (Oral)   Ht 5\' 4"  (1.626 m)   Wt 210 lb (95.3 kg)   SpO2 98%   BMI 36.05 kg/m  BP Readings from Last 3 Encounters:  05/23/18 (!) 148/98- recheck  11/17/17 (!) 150/98  09/10/17 134/84       Assessment & Plan:  1. Constipation, unspecified constipation type - LINZESS 72 MCG capsule; Take one capsule by mouth daily  Dispense: 90 capsule; Refill: 3  2. Obstructive sleep apnea syndrome - Ambulatory referral to Pulmonology  3. Need for Tdap vaccination - Tdap vaccine greater than or equal to 7yo IM  4. Need for influenza vaccination - Flu Vaccine QUAD 36+ mos IM  5. Elevated blood pressure reading - given instructions for DASH diet and taking home readings - she will keep log of weekly readings for 4-6 weeks and send to   6. Obesity (BMI 30-39.9) - encouraged increased activity, healthy food choices  - follow up in  6 months  Clarene Reamer, FNP-BC  [facility], [facility]  05/23/2018 4:15 PM

## 2018-06-06 ENCOUNTER — Encounter: Payer: Self-pay | Admitting: Internal Medicine

## 2018-06-06 ENCOUNTER — Ambulatory Visit: Payer: Federal, State, Local not specified - PPO | Admitting: Internal Medicine

## 2018-06-06 VITALS — BP 142/90 | HR 104 | Resp 16 | Ht 64.0 in | Wt 219.0 lb

## 2018-06-06 DIAGNOSIS — G4733 Obstructive sleep apnea (adult) (pediatric): Secondary | ICD-10-CM

## 2018-06-06 NOTE — Patient Instructions (Signed)
Will get a copy of your original sleep study and recent compliance report to send to DME.

## 2018-06-06 NOTE — Progress Notes (Signed)
[facility] Consultation      Assessment and Plan:  Obstructive sleep apnea. - Currently on CPAP for 11 years, current CPAP is about 42 to 43 years old, recently relocated from this area and we will be connecting her to a new DME provider for new supplies. - We will need to obtain her sleep study to confirm diagnosis as well as a most recent download. - Otherwise advised to continue nightly use of CPAP.  Obesity. - Contributory to sleep apnea, continue use of CPAP.  Weight loss recommended.  Return in about 1 year (around 06/07/2019).   Date: 06/06/2018  MRN# [id number] Diane Townsend 08/15/1975    Diane Townsend is a 42 y.o. old female seen in consultation for chief complaint of:    Chief Complaint  Patient presents with  . Consult    Referred by [facility] for OSA management.  . Sleep Apnea    pt on cpap therapy 11 years. She moved here from [place]. Needs new DME    HPI:   She was diagnosed with OSA about 11 years ago, back then she was sleeping 10 to 12 hours and waking up tired. She has been on CPAP consistently since that time, she is now on her 2nd machine, now about 43-43 years old.  She feels that she is doing well with it, she goes to bed at 1030, puts it on right away and falls asleep quickly. She wakes 2-3 times to urinate, wakes at 645 am, when wakes feels that she slept well. She wears it about 8 hours per night. She feels that she is doing well with her CPAP and is happy with her machine and interface. She is using nasal pillows, since starting. She has a card in it but we could not read it.  No sleep walking, no sleep paralysis, no cataplexy, no TMJ, no dentures.    PMHX:   Past Medical History:  Diagnosis Date  . Hypertensive disorder 05/09/2015  . IBS (irritable bowel syndrome)   . Obstructive sleep apnea syndrome 04/26/2014   Surgical Hx:  Past Surgical History:  Procedure Laterality Date  . ABLATION ON ENDOMETRIOSIS    . APPENDECTOMY   1992  . CHOLECYSTECTOMY  1992  . LAPAROSCOPIC HYSTERECTOMY  11/21/2015   Done for AUB, benign pathology. Ovaries still in place.  . TUBAL LIGATION    . WISDOM TOOTH EXTRACTION     Family Hx:  Family History  Problem Relation Age of Onset  . Ovarian cancer Maternal Grandmother   . Diabetes Paternal Grandmother   . Lung cancer Paternal Grandfather   . Arthritis Paternal Grandfather   . Hypertension Father   . Hyperlipidemia Father    Social Hx:   Social History   Tobacco Use  . Smoking status: Never Smoker  . Smokeless tobacco: Never Used  Substance Use Topics  . Alcohol use: No    Frequency: Never  . Drug use: No   Medication:    Current Outpatient Medications:  .  buPROPion (WELLBUTRIN SR) 150 MG 12 hr tablet, Take one tablet in am x 4 days, then increase to one tablet twice a day, Disp: 60 tablet, Rfl: 5 .  Cholecalciferol (VITAMIN D3 PO), Vitamin D3, Disp: , Rfl:  .  Docusate Sodium (COLACE PO), Colace, Disp: , Rfl:  .  LINZESS 72 MCG capsule, Take one capsule by mouth daily, Disp: 90 capsule, Rfl: 3 .  Multiple Vitamins-Minerals (MULTIVITAMIN ADULT PO), Vitamin-Mineral Supplement oral liquid, Disp: ,  Rfl:    Allergies:  Patient has no known allergies.  Review of Systems: Gen:  Denies  fever, sweats, chills HEENT: Denies blurred vision, double vision. bleeds, sore throat Cvc:  No dizziness, chest pain. Resp:   Denies cough or sputum production, shortness of breath Gi: Denies swallowing difficulty, stomach pain. Gu:  Denies bladder incontinence, burning urine Ext:   No Joint pain, stiffness. Skin: No skin rash,  hives  Endoc:  No polyuria, polydipsia. Psych: No depression, insomnia. Other:  All other systems were reviewed with the patient and were negative other that what is mentioned in the HPI.   Physical Examination:   VS: BP (!) 142/90 (BP Location: Left Arm, Cuff Size: Large)   Pulse (!) 104   Resp 16   Ht 5\' 4"  (1.626 m)   Wt 219 lb (99.3 kg)   SpO2  98%   BMI 37.59 kg/m   General Appearance: No distress  Neuro:without focal findings,  speech normal,  HEENT: PERRLA, EOM intact.   Pulmonary: normal breath sounds, No wheezing.  CardiovascularNormal S1,S2.  No m/r/g.   Abdomen: Benign, Soft, non-tender. Renal:  No costovertebral tenderness  GU:  No performed at this time. Endoc: No evident thyromegaly, no signs of acromegaly. Skin:   warm, no rashes, no ecchymosis  Extremities: normal, no cyanosis, clubbing.  Other findings:    LABORATORY PANEL:   CBC No results for input(s): WBC, HGB, HCT, PLT in the last 168 hours. ------------------------------------------------------------------------------------------------------------------  Chemistries  No results for input(s): NA, K, CL, CO2, GLUCOSE, BUN, CREATININE, CALCIUM, MG, AST, ALT, ALKPHOS, BILITOT in the last 168 hours.  Invalid input(s): GFRCGP ------------------------------------------------------------------------------------------------------------------  Cardiac Enzymes No results for input(s): TROPONINI in the last 168 hours. ------------------------------------------------------------  RADIOLOGY:  No results found.     Thank  you for the consultation and for allowing [facility] to assist in the care of your patient. Our recommendations are noted above.  Please contact us if we can be of further service.   Marda Stalker, M.D., F.C.C.P.  Board Certified in Internal Medicine, Pulmonary Medicine, [place], and Sleep Medicine.  [facility] Office Number: [phone]   06/06/2018

## 2018-07-07 DIAGNOSIS — G4733 Obstructive sleep apnea (adult) (pediatric): Secondary | ICD-10-CM | POA: Diagnosis not present

## 2018-07-25 DIAGNOSIS — G4733 Obstructive sleep apnea (adult) (pediatric): Secondary | ICD-10-CM | POA: Diagnosis not present

## 2018-11-14 ENCOUNTER — Other Ambulatory Visit: Payer: Federal, State, Local not specified - PPO

## 2018-11-21 ENCOUNTER — Encounter: Payer: Federal, State, Local not specified - PPO | Admitting: Family Medicine

## 2018-11-28 ENCOUNTER — Encounter: Payer: Federal, State, Local not specified - PPO | Admitting: Family Medicine

## 2018-12-22 DIAGNOSIS — K080 Exfoliation of teeth due to systemic causes: Secondary | ICD-10-CM | POA: Diagnosis not present

## 2019-02-28 DIAGNOSIS — G4733 Obstructive sleep apnea (adult) (pediatric): Secondary | ICD-10-CM | POA: Diagnosis not present

## 2019-03-07 ENCOUNTER — Telehealth: Payer: Self-pay | Admitting: *Deleted

## 2019-03-07 NOTE — Telephone Encounter (Addendum)
Received fax from [facility] requesting PA for Linzess 72 mcg.  PA completed on CoverMyMeds and approved from 02/05/2019 through 03/06/2020.  [facility] notified of approval via fax.

## 2019-05-19 ENCOUNTER — Other Ambulatory Visit: Payer: Self-pay | Admitting: Family Medicine

## 2019-05-19 DIAGNOSIS — R7303 Prediabetes: Secondary | ICD-10-CM

## 2019-05-19 DIAGNOSIS — E669 Obesity, unspecified: Secondary | ICD-10-CM

## 2019-05-19 NOTE — Progress Notes (Signed)
Labs entered for cpe.

## 2019-05-24 ENCOUNTER — Other Ambulatory Visit: Payer: Self-pay

## 2019-05-24 ENCOUNTER — Other Ambulatory Visit (INDEPENDENT_AMBULATORY_CARE_PROVIDER_SITE_OTHER): Payer: Federal, State, Local not specified - PPO

## 2019-05-24 DIAGNOSIS — R7303 Prediabetes: Secondary | ICD-10-CM | POA: Diagnosis not present

## 2019-05-24 DIAGNOSIS — E669 Obesity, unspecified: Secondary | ICD-10-CM

## 2019-05-24 LAB — COMPREHENSIVE METABOLIC PANEL
ALT: 34 U/L (ref 0–35)
AST: 27 U/L (ref 0–37)
Albumin: 4.4 g/dL (ref 3.5–5.2)
Alkaline Phosphatase: 39 U/L (ref 39–117)
BUN: 14 mg/dL (ref 6–23)
CO2: 29 mEq/L (ref 19–32)
Calcium: 9.9 mg/dL (ref 8.4–10.5)
Chloride: 103 mEq/L (ref 96–112)
Creatinine, Ser: 0.86 mg/dL (ref 0.40–1.20)
GFR: 86.6 mL/min (ref >60.00)
Glucose, Bld: 118 mg/dL — ABNORMAL HIGH (ref 70–99)
Potassium: 4.2 mEq/L (ref 3.5–5.1)
Sodium: 139 mEq/L (ref 135–145)
Total Bilirubin: 0.4 mg/dL (ref 0.2–1.2)
Total Protein: 7.8 g/dL (ref 6.0–8.3)

## 2019-05-24 LAB — LIPID PANEL
Cholesterol: 160 mg/dL (ref 0–200)
HDL: 45.8 mg/dL (ref >39.00)
NonHDL: 113.96
Total CHOL/HDL Ratio: 3
Triglycerides: 227 mg/dL — ABNORMAL HIGH (ref 0.0–149.0)
VLDL: 45.4 mg/dL — ABNORMAL HIGH (ref 0.0–40.0)

## 2019-05-24 LAB — LDL CHOLESTEROL, DIRECT: Direct LDL: 76 mg/dL

## 2019-05-24 LAB — HEMOGLOBIN A1C: Hgb A1c MFr Bld: 5.7 % (ref 4.6–6.5)

## 2019-05-29 ENCOUNTER — Other Ambulatory Visit: Payer: Self-pay | Admitting: Family Medicine

## 2019-05-29 ENCOUNTER — Other Ambulatory Visit: Payer: Self-pay

## 2019-05-29 ENCOUNTER — Encounter: Payer: Self-pay | Admitting: Family Medicine

## 2019-05-29 ENCOUNTER — Ambulatory Visit (INDEPENDENT_AMBULATORY_CARE_PROVIDER_SITE_OTHER): Payer: Federal, State, Local not specified - PPO | Admitting: Family Medicine

## 2019-05-29 VITALS — BP 138/92 | HR 86 | Temp 98.4°F | Ht 64.0 in | Wt 210.1 lb

## 2019-05-29 DIAGNOSIS — E6609 Other obesity due to excess calories: Secondary | ICD-10-CM | POA: Diagnosis not present

## 2019-05-29 DIAGNOSIS — Z0000 Encounter for general adult medical examination without abnormal findings: Secondary | ICD-10-CM

## 2019-05-29 DIAGNOSIS — Z6836 Body mass index (BMI) 36.0-36.9, adult: Secondary | ICD-10-CM

## 2019-05-29 DIAGNOSIS — G4733 Obstructive sleep apnea (adult) (pediatric): Secondary | ICD-10-CM

## 2019-05-29 DIAGNOSIS — H6121 Impacted cerumen, right ear: Secondary | ICD-10-CM | POA: Diagnosis not present

## 2019-05-29 DIAGNOSIS — Z1231 Encounter for screening mammogram for malignant neoplasm of breast: Secondary | ICD-10-CM

## 2019-05-29 DIAGNOSIS — L918 Other hypertrophic disorders of the skin: Secondary | ICD-10-CM

## 2019-05-29 NOTE — Progress Notes (Signed)
Subjective:    Patient ID: Diane Townsend, female    DOB: [birth year], 43 y.o.   MRN: [id number]  HPI This is a 43 yo female who presents today for annual exam. Has been doing well. Family has stayed healthy. One daughter, SIL, 68 yo granddaughter in [place], other daughter, SIL, grandson out of state. Parents still alive in [place]. Patient's husband had a stroke several years ago. Continues to only have use of one arm, some balance issues.   Last CPE- 08/25/17 with gyn Mammo- 09/14/2017, will call and schedule  Pap- no cervix Tdap- 05/23/2018 Flu-today Eye- overdue Dental- regular Exercise- walking Diet- has lost 10 pounds since last year, continues to have lemonade daily, has increased her salad intake, can do better with water  Depression- tapered off Welbutron about 3 months ago. Didn't like side effects. Feels like she is coping well.   Past Medical History:  Diagnosis Date  . Hypertensive disorder 05/09/2015  . IBS (irritable bowel syndrome)   . Obstructive sleep apnea syndrome 04/26/2014   Past Surgical History:  Procedure Laterality Date  . ABLATION ON ENDOMETRIOSIS    . APPENDECTOMY  1992  . CHOLECYSTECTOMY  1992  . LAPAROSCOPIC HYSTERECTOMY  11/21/2015   Done for AUB, benign pathology. Ovaries still in place.  . TUBAL LIGATION    . WISDOM TOOTH EXTRACTION     Family History  Problem Relation Age of Onset  . Ovarian cancer Maternal Grandmother   . Diabetes Paternal Grandmother   . Lung cancer Paternal Grandfather   . Arthritis Paternal Grandfather   . Hypertension Father   . Hyperlipidemia Father    Social History   Tobacco Use  . Smoking status: Never Smoker  . Smokeless tobacco: Never Used  Substance Use Topics  . Alcohol use: No    Frequency: Never  . Drug use: No      Review of Systems  Constitutional: Negative.   HENT: Negative.   Eyes: Negative.   Respiratory: Negative.   Cardiovascular: Negative.   Gastrointestinal: Positive for constipation  (takes linzess as needed, 4x/ week).  Endocrine: Negative.   Genitourinary: Negative.   Allergic/Immunologic: Positive for environmental allergies.  Neurological: Negative for headaches.  Hematological: Negative.   Psychiatric/Behavioral: Negative for dysphoric mood and sleep disturbance. The patient is not nervous/anxious.        Objective:   Physical Exam Vitals signs reviewed.  Constitutional:      General: She is not in acute distress.    Appearance: Normal appearance. She is obese. She is not ill-appearing, toxic-appearing or diaphoretic.  HENT:     Head: Normocephalic and atraumatic.     Right Ear: There is impacted cerumen.     Left Ear: Tympanic membrane, ear canal and external ear normal.  Eyes:     Conjunctiva/sclera: Conjunctivae normal.  Neck:     Musculoskeletal: Normal range of motion and neck supple.  Cardiovascular:     Rate and Rhythm: Normal rate and regular rhythm.     Heart sounds: Normal heart sounds.  Pulmonary:     Effort: Pulmonary effort is normal.     Breath sounds: Normal breath sounds.  Chest:     Breasts: Breasts are symmetrical.        Right: Normal. No swelling, bleeding, inverted nipple, mass, nipple discharge, skin change or tenderness.        Left: Normal. No swelling, bleeding, inverted nipple, mass, nipple discharge, skin change or tenderness.  Abdominal:     General: Abdomen  is flat. There is no distension.     Palpations: Abdomen is soft. There is no mass.     Tenderness: There is no abdominal tenderness. There is no guarding or rebound.     Hernia: No hernia is present.  Musculoskeletal: Normal range of motion.     Right lower leg: No edema.     Left lower leg: No edema.  Lymphadenopathy:     Upper Body:     Right upper body: No supraclavicular, axillary or pectoral adenopathy.     Left upper body: No supraclavicular, axillary or pectoral adenopathy.  Skin:    General: Skin is warm and dry.     Comments: Scattered, non  erythematous skin tags under right arm. Small skin tag on left upper eyelid.   Neurological:     Mental Status: She is alert and oriented to person, place, and time.  Psychiatric:        Mood and Affect: Mood normal.        Behavior: Behavior normal.        Thought Content: Thought content normal.        Judgment: Judgment normal.       BP (!) 138/92 (BP Location: Left Arm, Patient Position: Sitting, Cuff Size: Large)   Pulse 86   Temp 98.4 F (36.9 C) (Temporal)   Ht 5\' 4"  (1.626 m)   Wt 210 lb 1.9 oz (95.3 kg)   SpO2 97%   BMI 36.07 kg/m  Wt Readings from Last 3 Encounters:  05/29/19 210 lb 1.9 oz (95.3 kg)  06/06/18 219 lb (99.3 kg)  05/23/18 210 lb (95.3 kg)   Depression screen [facility] 2/9 05/29/2019 05/23/2018 09/10/2017  Decreased Interest 0 0 0  Down, Depressed, Hopeless 0 0 0  PHQ - 2 Score 0 0 0       Assessment & Plan:  1. Annual physical exam - Discussed and encouraged healthy lifestyle choices- adequate sleep, regular exercise, stress management and healthy food choices.  - reviewed labs and overdue health maintenance, provided her information to schedule her mammogram  2. Obstructive sleep apnea syndrome - good results with CPAP  3. Impacted cerumen of right ear - discussed use of OTC ear wax softening agents and she is able to flush at home as needed  4. Class 2 obesity due to excess calories without serious comorbidity with body mass index (BMI) of 36.0 to 36.9 in adult - some weight loss over last year, discussed balanced plate and provided written information regarding healthy diet and encouraged continued walking and healthy food choices  5. Skin tag, acquired - discussed removal of eye lid skin tag and will refer to dermatology  - follow up in 1 year  Clarene Reamer, FNP-BC  [facility] [place]  05/29/2019 3:07 PM

## 2019-05-29 NOTE — Patient Instructions (Addendum)
Please call and schedule an appointment for screening mammogram. [facility]- [phone]  A resource that I like is [email or web address]  Here are some guidelines to help you with meal planning -  Avoid all processed and packaged foods (bread, pasta, crackers, chips, etc) and beverages containing calories.  Avoid added sugars and excessive natural sugars.  Attention to how you feel if you consume artificial sweeteners.  Do they make you more hungry or raise your blood sugar?  With every meal and snack, aim to get 20 g of protein (3 ounces of meat, 4 ounces of fish, 3 eggs, protein powder, 1 cup [place] yogurt, 1 cup cottage cheese, etc.)  Increase fiber in the form of non-starchy vegetables.  These help you feel full with very little carbohydrates and are good for gut health.  Eat 1 serving healthy carb per meal- 1/2 cup brown rice, beans, potato, corn- pay attention to whether or not this significantly raises your blood sugar. If it does, reduce the frequency you consume these.   Eat 2-3 servings of lower sugar fruits daily.  This includes berries, apples, oranges, peaches, pears, one half banana.  Have small amounts of good fats such as avocado, nuts, olive oil, nut butters, olives.  Add a little cheese to your salads to make them tasty.

## 2019-05-31 ENCOUNTER — Ambulatory Visit
Admission: RE | Admit: 2019-05-31 | Discharge: 2019-05-31 | Disposition: A | Discharge status: Home / Self Care | Payer: Federal, State, Local not specified - PPO | Source: Ambulatory Visit | Attending: Family Medicine | Admitting: Family Medicine

## 2019-05-31 DIAGNOSIS — Z1231 Encounter for screening mammogram for malignant neoplasm of breast: Secondary | ICD-10-CM | POA: Diagnosis not present

## 2019-06-06 ENCOUNTER — Encounter: Payer: Self-pay | Admitting: Pulmonary Disease

## 2019-06-06 ENCOUNTER — Other Ambulatory Visit: Payer: Self-pay

## 2019-06-06 ENCOUNTER — Ambulatory Visit: Payer: Federal, State, Local not specified - PPO | Admitting: Pulmonary Disease

## 2019-06-06 VITALS — BP 122/84 | HR 82 | Temp 97.1°F | Ht 64.0 in | Wt 211.0 lb

## 2019-06-06 DIAGNOSIS — E669 Obesity, unspecified: Secondary | ICD-10-CM

## 2019-06-06 DIAGNOSIS — G4730 Sleep apnea, unspecified: Secondary | ICD-10-CM | POA: Diagnosis not present

## 2019-06-06 DIAGNOSIS — G4733 Obstructive sleep apnea (adult) (pediatric): Secondary | ICD-10-CM

## 2019-06-06 DIAGNOSIS — Z9989 Dependence on other enabling machines and devices: Secondary | ICD-10-CM | POA: Diagnosis not present

## 2019-06-06 NOTE — Patient Instructions (Signed)
Will arrange for new CPAP machine  Can follow up with Dr. Halford Chessman or Nurse Practitioner in 2 months in [place] office

## 2019-06-06 NOTE — Progress Notes (Signed)
[facility]  Chief Complaint  Patient presents with  . Follow-up    OSA. Diane Townsend reports her cpap is not working well. Diane Townsend often has to tap it or hit on it to make it work.     Constitutional:  BP 122/84 (BP Location: Right Arm, Patient Position: Sitting, Cuff Size: Normal)   Pulse 82   Temp (!) 97.1 F (36.2 C) (Temporal)   Ht 5\' 4"  (1.626 m)   Wt 211 lb (95.7 kg)   SpO2 100%   BMI 36.22 kg/m   Past Medical History:  IBS, HTN  Brief Summary:  Diane Townsend is a 43 y.o. female with obstructive sleep apnea.  Diane Townsend had sleep study in 2009.  Weight was 188 lbs then.  Found to have moderate sleep apnea.  Has been using CPAP 13 cm H2O.    Her machine is several years old.  Diane Townsend has to beat on it to keep it working at times.  Diane Townsend uses nasal pillows.  No issues with mask fit.  Not having sinus congestion, sore throat, dry mouth, or aerophagia.  Asked about Inspire device for tx of OSA.  Physical Exam:   Appearance - well kempt   ENMT - clear nasal mucosa, midline nasal  septum, no oral exudates, no LAN, trachea midline, MP 4, enlarged tongue, prominent salivary glands  Respiratory - normal chest wall, normal respiratory effort, no accessory muscle use, no wheeze/rales  CV - s1s2 regular rate and rhythm, no murmurs, no peripheral edema, radial pulses symmetric  GI - soft, non tender, no masses  Lymph - no adenopathy noted in neck and axillary areas  MSK - normal gait  Ext - no cyanosis, clubbing, or joint inflammation noted  Skin - no rashes, lesions, or ulcers  Neuro - normal strength, oriented x 3  Psych - normal mood and affect    Assessment/Plan:   Obstructive sleep apnea. - reviewed her sleep study results with her - Diane Townsend is compliant with therapy - Diane Townsend needs new equipment; her current machine is quite old, not functioning properly, and not amenable to repair - will arrange for new CPAP at 13 cm H2O - advised Diane Townsend might need  to have a home sleep study for insurance coverage prior to getting new CPAP machine - Diane Townsend will read up on Inspire device and let us know if Diane Townsend would like referral to ENT  Obesity. - encouraged her to keep up with weight loss efforts   Patient Instructions  Will arrange for new CPAP machine  Can follow up with Dr. Halford Chessman or Nurse Practitioner in 2 months in [place] office    Chesley Mires, MD [place] Pager: [phone] 06/06/2019, 4:40 PM  Flow Sheet      Sleep tests:  PSG 10/27/07 >> 20.6, SpO2 low 72% CPAP 05/08/19 to 06/06/19 >> used on 30 of 30 nights with average 8 hrs 40 min.  Average AHI 4.3 with CPAP 13 cm H2O.  Medications:   Allergies as of 06/06/2019   No Known Allergies     Medication List       Accurate as of June 06, 2019  4:40 PM. If you have any questions, ask your nurse or doctor.        STOP taking these medications   COLACE PO Stopped by: Chesley Mires, MD     TAKE these medications   Linzess 72 MCG capsule Generic drug: linaclotide Take one capsule by mouth daily  MULTIVITAMIN ADULT PO Vitamin-Mineral Supplement oral liquid   VITAMIN D3 PO Vitamin D3       Past Surgical History:  Diane Townsend  has a past surgical history that includes Laparoscopic hysterectomy (11/21/2015); Tubal ligation; Wisdom tooth extraction; Ablation on endometriosis; Appendectomy (1992); and Cholecystectomy (1992).  Family History:  Her family history includes Arthritis in her paternal grandfather; Diabetes in her paternal grandmother; Hyperlipidemia in her father; Hypertension in her father; Lung cancer in her paternal grandfather; Ovarian cancer in her maternal grandmother.  Social History:  Diane Townsend  reports that Diane Townsend has never smoked. Diane Townsend has never used smokeless tobacco. Diane Townsend reports that Diane Townsend does not drink alcohol or use drugs.

## 2019-06-08 DIAGNOSIS — Z20828 Contact with and (suspected) exposure to other viral communicable diseases: Secondary | ICD-10-CM | POA: Diagnosis not present

## 2019-06-08 DIAGNOSIS — G4733 Obstructive sleep apnea (adult) (pediatric): Secondary | ICD-10-CM | POA: Diagnosis not present

## 2019-07-08 DIAGNOSIS — G4733 Obstructive sleep apnea (adult) (pediatric): Secondary | ICD-10-CM | POA: Diagnosis not present

## 2019-07-28 ENCOUNTER — Encounter: Payer: Self-pay | Admitting: Family Medicine

## 2019-07-28 DIAGNOSIS — K5900 Constipation, unspecified: Secondary | ICD-10-CM

## 2019-07-31 MED ORDER — LINZESS 72 MCG PO CAPS: ORAL_CAPSULE | ORAL | 1 refills | Status: DC

## 2019-08-03 MED ORDER — LINZESS 72 MCG PO CAPS: ORAL_CAPSULE | ORAL | 1 refills | Status: DC

## 2019-08-03 NOTE — Addendum Note (Signed)
Addended by: Kris Mouton on: 08/03/2019 09:21 AM   Modules accepted: Orders

## 2019-08-08 DIAGNOSIS — G4733 Obstructive sleep apnea (adult) (pediatric): Secondary | ICD-10-CM | POA: Diagnosis not present

## 2019-09-08 DIAGNOSIS — G4733 Obstructive sleep apnea (adult) (pediatric): Secondary | ICD-10-CM | POA: Diagnosis not present

## 2019-09-11 ENCOUNTER — Ambulatory Visit (INDEPENDENT_AMBULATORY_CARE_PROVIDER_SITE_OTHER): Payer: Federal, State, Local not specified - PPO | Admitting: Acute Care

## 2019-09-11 ENCOUNTER — Encounter: Payer: Self-pay | Admitting: Acute Care

## 2019-09-11 ENCOUNTER — Other Ambulatory Visit: Payer: Self-pay

## 2019-09-11 DIAGNOSIS — Z9989 Dependence on other enabling machines and devices: Secondary | ICD-10-CM | POA: Diagnosis not present

## 2019-09-11 DIAGNOSIS — G4733 Obstructive sleep apnea (adult) (pediatric): Secondary | ICD-10-CM

## 2019-09-11 DIAGNOSIS — R400 Somnolence: Secondary | ICD-10-CM | POA: Diagnosis not present

## 2019-09-11 NOTE — Progress Notes (Addendum)
Virtual Visit via Telephone Note  I connected with Diane Townsend on 09/11/19 at  4:00 PM EST by telephone and verified that I am speaking with the correct person using two identifiers.  Location: Patient: At home Provider:  [place], [place], [address]    I discussed the limitations, risks, security and privacy concerns of performing an evaluation and management service by telephone and the availability of in person appointments. I also discussed with the patient that there may be a patient responsible charge related to this service. The patient expressed understanding and agreed to proceed.  Synopsis: Diane Townsend is a 44 y.o. female never smoker with obstructive sleep apnea. She saw Dr. Halford Chessman 05/2019. She has been using the same CPAP machine since 2009. Her machine was replaced at that time.  New CPAP has been set to the same settings of 13 cm H2O .   PMH IBS, HTN  History of Present Illness: Pt. Presents for follow up. She got her new machine November/ December 2020. She has been using her device every night. She has 100% compliance with good control of her AHI. 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 He awakens her 2-3 times a night. He has just moved to a long term care facility. She works a 7 am--3 pm  job. She states she does occasionally have a headache upon awakening. She would like to start [facility] for her daytime sleepiness.     Observations/Objective: Down Load 08/07/2019-09/07/2019 100 % compliance Average usage per night 8 hours  Set pressure of 13 cm H2O AHI of 2.1   PSG 10/27/07 >> 20.6, SpO2 low 72% CPAP 05/08/19 to 06/06/19 >> used on 30 of 30 nights with average 8 hrs 40 min.  Average AHI 4.3 with CPAP 13 cm H2O  Assessment and Plan: OSA on CPAP Excellent compliance  AHI well controlled. ( 2.1) Plan Continue on CPAP at bedtime. You appear to be benefiting from the treatment  Goal is to wear  for at least 6 hours each night for maximal clinical benefit. Continue to work on weight loss, as the link between excess weight  and sleep apnea is well established.   Remember to establish a good bedtime routine, and work on sleep hygiene.  Limit daytime naps , avoid stimulants such as caffeine and nicotine close to bedtime, exercise daily to promote sleep quality, avoid heavy , spicy, fried , or rich foods before bed. Ensure adequate exposure to natural light during the day,establish a relaxing bedtime routine with a pleasant sleep environment ( Bedroom between 60 and 67 degrees, turn off bright lights , TV or device screens screens , consider black out curtains or white noise machines) Do not drive if sleepy. Remember to clean mask, tubing, filter, and reservoir once weekly with soapy water.  Follow up with Dr. Mortimer Fries   In 4 months  or before as needed.     Follow Up Instructions: Follow up with Dr. Mortimer Fries   In 4 months  or before as needed.      I discussed the assessment and treatment plan with the patient. The patient was provided an opportunity to ask questions and all were answered. The patient agreed with the plan and demonstrated an understanding of the instructions.   The patient was advised to call back or seek an in-person evaluation if the symptoms worsen or if the condition fails to improve as anticipated.  I  provided 25  minutes of non-face-to-face time during this encounter.   Magdalen Spatz, NP 09/11/2019 4:46 PM

## 2019-09-11 NOTE — Patient Instructions (Signed)
It was great to speak with you today. I will message Dr.Kasa regarding your request for Sunosi. Continue using CPAP as you have been doing. You appear to be benefiting from the treatment . Goal is to wear for at least 6 hours each night for maximal clinical benefit. Continue to work on weight loss, as the link between excess weight  and sleep apnea is well established.   Remember to establish a good bedtime routine, and work on sleep hygiene.  Limit daytime naps , avoid stimulants such as caffeine and nicotine close to bedtime, exercise daily to promote sleep quality, avoid heavy , spicy, fried , or rich foods before bed. Ensure adequate exposure to natural light during the day,establish a relaxing bedtime routine with a pleasant sleep environment ( Bedroom between 60 and 67 degrees, turn off bright lights , TV or device screens screens , consider black out curtains or white noise machines) Do not drive if sleepy. Remember to clean mask, tubing, filter, and reservoir once weekly with soapy water.  Follow up with Dr. Mortimer Fries   In  4 months  or before as needed.  Please contact office for sooner follow up if symptoms do not improve or worsen or seek emergency care

## 2019-09-18 MED ORDER — SUNOSI 75 MG PO TABS: 37.5000 mg | ORAL_TABLET | Freq: Every day | ORAL | 5 refills | Status: DC

## 2019-09-18 NOTE — Telephone Encounter (Signed)
lmtcb X1 for pt, sent mychart message to pt letting her know she needs a 1 month follow up.

## 2019-09-18 NOTE — Telephone Encounter (Signed)
Please let her know I have sent script for sunosi 37.5 mg daily.  She needs f/u visit in 1 month to assess her tolerance of medication.

## 2019-09-20 NOTE — Telephone Encounter (Signed)
Mychart message received which I have posted below:  To: [facility]OL    From: Diane Townsend    Created: 09/20/2019 3:28 PM     *-*-*This message was handled on 09/20/2019 4:32 PM by Anyae Griffith P*-*-*  Thank you for calling in [facility] for me. The insurance company only pays 40% of this medication and at this time there is no generic. My out of pocket would be  ~$393 which I can't afford. They told me about Provigil and Nuvigil which both have an affordable generic. Would you suggest either of these to help with my daytime sleepiness? If so my insurance company, [employer], said that there would have to be a preapproval for this and that your office could call [phone] to start that process.   Thanks, [employer], can you look into this for pt please.

## 2019-09-22 NOTE — Telephone Encounter (Signed)
Called number on [employer], they will resend patient email with pharmacy billing info for her savings card. Called patient and advised.   Patient says [facility] is requesting PA for [facility]. Please submit PA for patient.  Sunosi# [id number]  10:31 AM Diane Townsend, CPhT

## 2019-09-22 NOTE — Telephone Encounter (Signed)
Rachael, please see pt's recent mychart messages and see if you are able to help her and [place] out with this. Thanks!

## 2019-09-22 NOTE — Telephone Encounter (Signed)
PA initiated via cover my meds:  Medication name and strength: Sunosi 75mg  Provider: [place]: [facility] Patient insurance ID: [id number] Phone: [phone] Fax: [phone]  Was the PA started on CMM?  yes If yes, please enter the Key: [id number] Timeframe for approval/denial: Your request has been approved Your PA request has been approved. Additional information will be provided in the approval communication.    Sent response to pt's mychart message letting her know that the PA was approved.

## 2019-10-01 NOTE — Telephone Encounter (Signed)
Please let pt know that her insurance would likely require her to try modafinil or armodafinil first prior to allowing for use of sunosi.  If she is agreeable to this, then let me know and I can change prescription order.

## 2019-10-02 ENCOUNTER — Telehealth: Payer: Self-pay | Admitting: Pulmonary Disease

## 2019-10-02 MED ORDER — ARMODAFINIL 150 MG PO TABS: 150.0000 mg | ORAL_TABLET | Freq: Every day | ORAL | 5 refills | Status: DC

## 2019-10-02 NOTE — Telephone Encounter (Signed)
Pt has a f/u scheduled with [facility] 3/31. Pt needs to keep that appt to monitor response to therapy.  Attempted to call pt to let her know that VS sent rx to pharmacy for her but unable to reach. Left message for her to return call.

## 2019-10-02 NOTE — Telephone Encounter (Signed)
Dr. Halford Chessman, pt states either medication is fine to send in for her.

## 2019-10-02 NOTE — Telephone Encounter (Signed)
See email thread from 09/20/19.  Please let her know that I have sent script for armodafinil (nuvigil) 150 mg.  She is to take this in the morning daily.  She needs tele visit with me or NP to monitor her response to therapy.

## 2019-10-03 NOTE — Telephone Encounter (Signed)
Pt returning call. [phone]

## 2019-10-03 NOTE — Telephone Encounter (Signed)
Called and spoke with Patient.  Patient aware of prescription being sent to pharmacy, and reminded of follow up with Beth,NP, 3/31/.  Understanding stated.  Nothing further at this time.

## 2019-10-18 ENCOUNTER — Ambulatory Visit (INDEPENDENT_AMBULATORY_CARE_PROVIDER_SITE_OTHER): Payer: Federal, State, Local not specified - PPO | Admitting: Primary Care

## 2019-10-18 ENCOUNTER — Encounter: Payer: Self-pay | Admitting: Primary Care

## 2019-10-18 DIAGNOSIS — R400 Somnolence: Secondary | ICD-10-CM | POA: Diagnosis not present

## 2019-10-18 NOTE — Progress Notes (Signed)
Virtual Visit via Telephone Note  I connected with Diane Townsend on 10/18/19 at  2:30 PM EDT by telephone and verified that I am speaking with the correct person using two identifiers.  Location: Patient: Home Provider: Office   I discussed the limitations, risks, security and privacy concerns of performing an evaluation and management service by telephone and the availability of in person appointments. I also discussed with the patient that there may be a patient responsible charge related to this service. The patient expressed understanding and agreed to proceed.   History of Present Illness: 44 year old female, never smoked. PMH significant for HTN, OSA, class 2 obesity. Patient of Dr. Halford Chessman, last visit with pulmonary NP on 09/11/19. Maintained on CPAP at 13cm h20.   10/18/2019 Patient had visit with pulmonary NP in February for CPAP follow-up, during this visit she reported persistent daytime sleepiness. Patient was sent in prescription for Sunosi but medication was not covered by insurance. She was ultimately started on Nuvigil 150mg  earlier this month. She feels it has really helped her. She takes it first thing in the morning. No reported side effects. Epworth score 3/24.    Observations/Objective:  - Appears well; no shortness of breath, wheezing or cough  Assessment and Plan:  Daytime fatigue with OSA - Improved  - Continue Nuvigil 150mg  once daily in the morning  - Monitor BP/HR, notify office if you experience any unwanted side effects   Follow Up Instructions:  - 6 months with Dr. Halford Chessman  I discussed the assessment and treatment plan with the patient. The patient was provided an opportunity to ask questions and all were answered. The patient agreed with the plan and demonstrated an understanding of the instructions.   The patient was advised to call back or seek an in-person evaluation if the symptoms worsen or if the condition fails to improve as anticipated.  I provided  15 minutes of non-face-to-face time during this encounter.   Martyn Ehrich, NP

## 2019-10-19 ENCOUNTER — Encounter: Payer: Self-pay | Admitting: Primary Care

## 2019-10-19 NOTE — Patient Instructions (Signed)
Continue Nuvigil 150mg  once daily in the morning  - Monitor BP/HR, notify office if you experience any unwanted side effects   Follow Up Instructions:  - 6 months with Dr. Halford Chessman

## 2019-10-23 NOTE — Progress Notes (Signed)
Reviewed and agree with assessment/plan.   Jaquisha Frech, MD [place] [facility] 07/15/2016, 12:24 PM Pager:  [phone]  

## 2019-12-01 ENCOUNTER — Encounter: Payer: Self-pay | Admitting: Emergency Medicine

## 2019-12-01 ENCOUNTER — Other Ambulatory Visit: Payer: Self-pay

## 2019-12-01 ENCOUNTER — Ambulatory Visit
Admission: EM | Admit: 2019-12-01 | Discharge: 2019-12-01 | Disposition: A | Discharge status: Home / Self Care | Payer: Federal, State, Local not specified - PPO | Attending: Emergency Medicine | Admitting: Emergency Medicine

## 2019-12-01 DIAGNOSIS — I10 Essential (primary) hypertension: Secondary | ICD-10-CM

## 2019-12-01 MED ORDER — AMLODIPINE BESYLATE 5 MG PO TABS: 5.0000 mg | ORAL_TABLET | Freq: Every day | ORAL | 0 refills | Status: DC

## 2019-12-01 NOTE — ED Provider Notes (Signed)
Roderic Palau    CSN: [id number] Arrival date & time: 12/01/19  1400      History   Chief Complaint Chief Complaint  Patient presents with  . Hypertension    HPI Diane Townsend is a 44 y.o. female.   Patient presents with elevated blood pressure.  She states she went to the dentist yesterday for procedure and was unable to have it due to elevated blood pressure.  She has taken her blood pressure at home numerous times since her appointment yesterday;  SBP ranging 150-200, DBP 90-120.  She denies focal weakness, dizziness, headache, chest pain, SOB, edema, or any other symptoms.  Patient states she has a history of whitecoat hypertension but is not on any medications.  She also reports considerable stress recently.    The history is provided by the patient.    Past Medical History:  Diagnosis Date  . Hypertensive disorder 05/09/2015  . IBS (irritable bowel syndrome)   . Obstructive sleep apnea syndrome 04/26/2014    Patient Active Problem List   Diagnosis Date Noted  . Class 2 obesity due to excess calories without serious comorbidity with body mass index (BMI) of 36.0 to 36.9 in adult 05/29/2019  . IBS (irritable bowel syndrome)   . Hypertensive disorder 05/09/2015  . Lack of energy 05/09/2015  . Impacted cerumen 10/15/2014  . Obstructive sleep apnea syndrome 04/26/2014    Past Surgical History:  Procedure Laterality Date  . ABLATION ON ENDOMETRIOSIS    . APPENDECTOMY  1992  . CHOLECYSTECTOMY  1992  . LAPAROSCOPIC HYSTERECTOMY  11/21/2015   Done for AUB, benign pathology. Ovaries still in place.  . TUBAL LIGATION    . WISDOM TOOTH EXTRACTION      OB History    Gravida  2   Para  2   Term  2   Preterm  0   AB  0   Living  2     SAB  0   TAB  0   Ectopic  0   Multiple  0   Live Births  2            Home Medications    Prior to Admission medications   Medication Sig Start Date End Date Taking? Authorizing Provider  Armodafinil 150  MG tablet Take 1 tablet (150 mg total) by mouth daily. 10/02/19  Yes Chesley Mires, MD  Cholecalciferol (VITAMIN D3 PO) Vitamin D3   Yes [provider]  LINZESS 72 MCG capsule Take one capsule by mouth daily 08/03/19  Yes Elby Beck, FNP  Multiple Vitamins-Minerals (MULTIVITAMIN ADULT PO) Vitamin-Mineral Supplement oral liquid   Yes [provider]  Omega-3 Fatty Acids (FISH OIL PO) Take by mouth.   Yes [provider]  amLODipine (NORVASC) 5 MG tablet Take 1 tablet (5 mg total) by mouth daily. 12/01/19   Sharion Balloon, NP    Family History Family History  Problem Relation Age of Onset  . Ovarian cancer Maternal Grandmother   . Diabetes Paternal Grandmother   . Lung cancer Paternal Grandfather   . Arthritis Paternal Grandfather   . Hypertension Father   . Hyperlipidemia Father   . Breast cancer Neg Hx     Social History Social History   Tobacco Use  . Smoking status: Never Smoker  . Smokeless tobacco: Never Used  Substance Use Topics  . Alcohol use: No  . Drug use: No     Allergies   Patient has no known  allergies.   Review of Systems Review of Systems  Constitutional: Negative for chills and fever.  HENT: Negative for ear pain and sore throat.   Eyes: Negative for pain and visual disturbance.  Respiratory: Negative for cough and shortness of breath.   Cardiovascular: Negative for chest pain and palpitations.  Gastrointestinal: Negative for abdominal pain and vomiting.  Genitourinary: Negative for dysuria and hematuria.  Musculoskeletal: Negative for arthralgias and back pain.  Skin: Negative for color change and rash.  Neurological: Negative for dizziness, tremors, seizures, syncope, facial asymmetry, speech difficulty, weakness, numbness and headaches.  All other systems reviewed and are negative.    Physical Exam Triage Vital Signs ED Triage Vitals  Enc Vitals Group     BP      Pulse      Resp      Temp      Temp src       SpO2      Weight      Height      Head Circumference      Peak Flow      Pain Score      Pain Loc      Pain Edu?      Excl. in [place]?    No data found.  Updated Vital Signs BP (!) 168/113 (BP Location: Left Arm)   Pulse (!) 104   Temp 99.5 F (37.5 C) (Oral)   Resp 18   Ht 5\' 4"  (1.626 m)   Wt 210 lb 15.7 oz (95.7 kg)   SpO2 98%   BMI 36.21 kg/m   Visual Acuity Right Eye Distance:   Left Eye Distance:   Bilateral Distance:    Right Eye Near:   Left Eye Near:    Bilateral Near:     Physical Exam Vitals and nursing note reviewed.  Constitutional:      General: She is not in acute distress.    Appearance: She is well-developed. She is not ill-appearing.  HENT:     Head: Normocephalic and atraumatic.     Mouth/Throat:     Mouth: Mucous membranes are moist.  Eyes:     Conjunctiva/sclera: Conjunctivae normal.     Pupils: Pupils are equal, round, and reactive to light.  Cardiovascular:     Rate and Rhythm: Regular rhythm. Tachycardia present.     Heart sounds: Normal heart sounds. No murmur.  Pulmonary:     Effort: Pulmonary effort is normal. No respiratory distress.     Breath sounds: Normal breath sounds.  Abdominal:     Palpations: Abdomen is soft.     Tenderness: There is no abdominal tenderness. There is no guarding or rebound.  Musculoskeletal:     Cervical back: Neck supple.     Right lower leg: No edema.     Left lower leg: No edema.  Skin:    General: Skin is warm and dry.     Findings: No rash.  Neurological:     General: No focal deficit present.     Mental Status: She is alert and oriented to person, place, and time.     Cranial Nerves: No cranial nerve deficit.     Sensory: No sensory deficit.     Motor: No weakness.     Coordination: Coordination normal.     Gait: Gait normal.  Psychiatric:        Mood and Affect: Mood normal.        Behavior: Behavior normal.  UC Treatments / Results  Labs (all labs ordered are listed, but only  abnormal results are displayed) Labs Reviewed  CBC  COMPREHENSIVE METABOLIC PANEL    EKG   Radiology No results found.  Procedures Procedures (including critical care time)  Medications Ordered in UC Medications - No data to display  Initial Impression / Assessment and Plan / UC Course  I have reviewed the triage vital signs and the nursing notes.  Pertinent labs & imaging results that were available during my care of the patient were reviewed by me and considered in my medical decision making (see chart for details).   Elevated blood pressure with diagnosis of HTN.  Patient is asymptomatic.  EKG shows sinus rhythm, rate 75, no ST elevation, no previous to compare.  Starting amlodipine 5 mg daily.  Instructed patient to call her PCP to schedule an appointment for next week.  Instructed her to follow the DASH diet for hypertension.  Instructed her to keep a log of her blood pressure readings.  Strict instructions given to patient for going to the ED if she becomes symptomatic or has very high blood pressure readings.  Patient agrees to plan of care.      Final Clinical Impressions(s) / UC Diagnoses   Final diagnoses:  Elevated blood pressure reading in office with diagnosis of hypertension     Discharge Instructions     Call your primary care provider to schedule an appointment for next week.    Start taking the amlodipine as directed.    Keep a log of your blood pressure readings.    Follow the attached diet for high blood pressure.    Go to the Emergency Department if you have symptoms such as weakness, dizziness, chest pain, shortness of breath; Or if your blood pressure reading is very high.        ED Prescriptions    Medication Sig Dispense Auth. Provider   amLODipine (NORVASC) 5 MG tablet Take 1 tablet (5 mg total) by mouth daily. 30 tablet Sharion Balloon, NP     PDMP not reviewed this encounter.   Sharion Balloon, NP 12/01/19 [phone]

## 2019-12-01 NOTE — Discharge Instructions (Addendum)
Call your primary care provider to schedule an appointment for next week.    Start taking the amlodipine as directed.    Keep a log of your blood pressure readings.    Follow the attached diet for high blood pressure.    Go to the Emergency Department if you have symptoms such as weakness, dizziness, chest pain, shortness of breath; Or if your blood pressure reading is very high.

## 2019-12-01 NOTE — ED Triage Notes (Signed)
Pt states her BP has been elevated, running 150-200's/100-120. She states she went to the dentist yesterday and was unable to get her dental work due to her BP being elevated, she has been checking at home since. She denies any chest pain or shortness of breath. She has had white coat hypertension in the past but never taken medication. She states she has had a lot of stress in her life lately.

## 2019-12-02 LAB — CBC
Hematocrit: 41.8 % (ref 34.0–46.6)
Hemoglobin: 13.8 g/dL (ref 11.1–15.9)
MCH: 26.8 pg (ref 26.6–33.0)
MCHC: 33 g/dL (ref 31.5–35.7)
MCV: 81 fL (ref 79–97)
Platelets: 290 10*3/uL (ref 150–450)
RBC: 5.15 x10E6/uL (ref 3.77–5.28)
RDW: 13.6 % (ref 11.7–15.4)
WBC: 4.5 10*3/uL (ref 3.4–10.8)

## 2019-12-02 LAB — COMPREHENSIVE METABOLIC PANEL
ALT: 33 IU/L — ABNORMAL HIGH (ref 0–32)
AST: 25 IU/L (ref 0–40)
Albumin/Globulin Ratio: 1.4 (ref 1.2–2.2)
Albumin: 4.4 g/dL (ref 3.8–4.8)
Alkaline Phosphatase: 55 IU/L (ref 39–117)
BUN/Creatinine Ratio: 15 (ref 9–23)
BUN: 12 mg/dL (ref 6–24)
Bilirubin Total: 0.4 mg/dL (ref 0.0–1.2)
CO2: 22 mmol/L (ref 20–29)
Calcium: 9.7 mg/dL (ref 8.7–10.2)
Chloride: 105 mmol/L (ref 96–106)
Creatinine, Ser: 0.81 mg/dL (ref 0.57–1.00)
GFR calc Af Amer: 102 mL/min/{1.73_m2} (ref >59)
GFR calc non Af Amer: 89 mL/min/{1.73_m2} (ref >59)
Globulin, Total: 3.2 g/dL (ref 1.5–4.5)
Glucose: 93 mg/dL (ref 65–99)
Potassium: 3.9 mmol/L (ref 3.5–5.2)
Sodium: 142 mmol/L (ref 134–144)
Total Protein: 7.6 g/dL (ref 6.0–8.5)

## 2020-01-02 ENCOUNTER — Telehealth: Payer: Self-pay | Admitting: Family Medicine

## 2020-01-02 MED ORDER — AMLODIPINE BESYLATE 5 MG PO TABS: 5.0000 mg | ORAL_TABLET | Freq: Every day | ORAL | 0 refills | Status: DC

## 2020-01-02 NOTE — Telephone Encounter (Signed)
Pt scheduled for UC follow up appt at 3pm on Friday 6/18  Aware that Rx has been sent for 30 day supply   Nothing further needed.

## 2020-01-02 NOTE — Telephone Encounter (Signed)
Covering for Diane Beck, FNP while out. 1 month refill sent to pharmacy.   Patient needs to schedule UC f/u appointment with PCP in next 30 days to recheck BP

## 2020-01-02 NOTE — Telephone Encounter (Signed)
Pt is wanting to know if Jackelyn Poling can refill her Amlodipine 5mg  Rx Prescribed by UC on 12/01/2019 - given #30 x 0 refills.  Pt is needing this refilled asap

## 2020-01-03 NOTE — Telephone Encounter (Signed)
Noted  

## 2020-01-05 ENCOUNTER — Ambulatory Visit: Payer: Federal, State, Local not specified - PPO | Admitting: Family Medicine

## 2020-01-05 ENCOUNTER — Other Ambulatory Visit: Payer: Self-pay

## 2020-01-05 ENCOUNTER — Encounter: Payer: Self-pay | Admitting: Family Medicine

## 2020-01-05 VITALS — BP 136/82 | HR 105 | Temp 97.3°F | Ht 64.0 in | Wt 208.8 lb

## 2020-01-05 DIAGNOSIS — I10 Essential (primary) hypertension: Secondary | ICD-10-CM | POA: Diagnosis not present

## 2020-01-05 DIAGNOSIS — Z636 Dependent relative needing care at home: Secondary | ICD-10-CM | POA: Diagnosis not present

## 2020-01-05 DIAGNOSIS — E6609 Other obesity due to excess calories: Secondary | ICD-10-CM | POA: Diagnosis not present

## 2020-01-05 DIAGNOSIS — Z6835 Body mass index (BMI) 35.0-35.9, adult: Secondary | ICD-10-CM | POA: Diagnosis not present

## 2020-01-05 NOTE — Patient Instructions (Signed)
Good to see you today  Please send me your blood pressure readings and I will get in touch with pulmonary about your readings and medication

## 2020-01-05 NOTE — Progress Notes (Signed)
Subjective:    Patient ID: Diane Townsend, female    DOB: Sep 14, 1975, 44 y.o.   MRN: [id number]  HPI Chief Complaint  Patient presents with  . Follow-up    UC f/u - seen at [facility] in [facility] 5/14 for BP - BP has been fine since seen. Pt feels that she was overwhelmed with personal stressors which caused her BP to become elevated along with the new medication Rx'd by Pulmonary.    This is a 44 yo female who presents today for above cc.  She has been under tremendous amount of stress.  Her husband was admitted to the hospital in December and ended up with a prolonged, multihospital and SNF stay due to pneumonia at the end contracting Covid.  He has been home for the last 2 months and is obtaining nursing, OT, PT, speech services and is slowly improving.  She has continued to work full-time from home.  Her parents have been able to help her a little intermittently to give her a break.  Her children are supportive.  Patient was seen in her dental office last month and had elevated blood pressure reading.  She was then seen at urgent care and started on amlodipine 5 mg.  She presents today for follow-up of her blood pressure.  Thinks that her blood pressure is elevated due to recent stressors and wonders if Nuvigil has also increased her blood pressure. Takes Nuvigil 8 am. BP around 10 am high.  He has been keeping a log of her blood pressure readings but she forgot to bring it in with her today. Energy level is good.  She feels that she has done well on the Nuvigil.  Review of Systems Denies chest pain, shortness of breath, visual change, headache, abdominal pain, nausea/vomiting, lower extremity swelling    Objective:   Physical Exam Physical Exam  Constitutional: Oriented to person, place, and time. She appears well-developed and well-nourished.  HENT:  Head: Normocephalic and atraumatic.  Eyes: Conjunctivae are normal.  Neck: Normal range of motion. Neck supple.  Cardiovascular: Normal rate,  regular rhythm and normal heart sounds.   Pulmonary/Chest: Effort normal and breath sounds normal.  Musculoskeletal: No LE edema.  Neurological: Alert and oriented to person, place, and time.  Skin: Skin is warm and dry.  Psychiatric: Normal mood and affect. Behavior is normal. Judgment and thought content normal.  Vitals reviewed.        BP 136/82 (BP Location: Left Arm, Patient Position: Sitting, Cuff Size: Large)   Pulse (!) 105   Temp (!) 97.3 F (36.3 C) (Temporal)   Ht 5\' 4"  (1.626 m)   Wt 208 lb 12.8 oz (94.7 kg)   SpO2 97%   BMI 35.84 kg/m  Wt Readings from Last 3 Encounters:  01/05/20 208 lb 12.8 oz (94.7 kg)  12/01/19 210 lb 15.7 oz (95.7 kg)  06/06/19 211 lb (95.7 kg)    Assessment & Plan:  1. Essential hypertension -Plan is for patient to send me her blood pressure log.  Question whether increase is related to new visual and or increased stress. -Will collaborate with pulmonary regarding medication  2. Caregiver stress -Discussed self-care, encouraged continued good sleep, regular outside activity and exercise, healthy food choices -Follow-up is on file for the fall  3. Class 2 obesity due to excess calories without serious comorbidity with body mass index (BMI) of 35.0 to 35.9 in adult -Has had very slight weight loss.  Difficult time to address with all of  her other issues.  This visit occurred during the SARS-CoV-2 public health emergency.  Safety protocols were in place, including screening questions prior to the visit, additional usage of staff PPE, and extensive cleaning of exam room while observing appropriate contact time as indicated for disinfecting solutions.      Clarene Reamer, FNP-BC  [facility] at [facility] [place] [facility]  01/05/2020 5:30 PM

## 2020-01-09 ENCOUNTER — Encounter: Payer: Self-pay | Admitting: Family Medicine

## 2020-01-16 ENCOUNTER — Encounter: Payer: Self-pay | Admitting: Family Medicine

## 2020-01-17 ENCOUNTER — Other Ambulatory Visit: Payer: Self-pay | Admitting: Family Medicine

## 2020-01-17 DIAGNOSIS — K5900 Constipation, unspecified: Secondary | ICD-10-CM

## 2020-01-17 MED ORDER — LINACLOTIDE 145 MCG PO CAPS: 145.0000 ug | ORAL_CAPSULE | Freq: Every day | ORAL | 5 refills | Status: DC

## 2020-01-31 ENCOUNTER — Other Ambulatory Visit: Payer: Self-pay | Admitting: Family Medicine

## 2020-01-31 DIAGNOSIS — K5900 Constipation, unspecified: Secondary | ICD-10-CM

## 2020-01-31 NOTE — Telephone Encounter (Signed)
PA placed on your desk.  

## 2020-02-05 ENCOUNTER — Telehealth: Payer: Self-pay | Admitting: *Deleted

## 2020-02-05 NOTE — Telephone Encounter (Signed)
APPROVED  The Prior Authorization request has been approved for Linzess 145MCG OR CAPS. The authorization is valid from 01/06/2020 through 02/04/2021. A letter of explanation will also be mailed to the patient.

## 2020-02-05 NOTE — Telephone Encounter (Signed)
Please look into this. I thought that PA had been done?

## 2020-02-05 NOTE — Telephone Encounter (Signed)
I am working on getting approval through [facility]

## 2020-02-05 NOTE — Telephone Encounter (Signed)
Patient left a voicemail stating that she has been trying to get a refill on her Linzess. Patient stated that she was told by the pharmacy that our office needs to contact [employer] and get approval for this medication. Patient stated that she is hoping to get the [place] changed to 145 mg and approved by her [employer].

## 2020-02-05 NOTE — Telephone Encounter (Signed)
Linzess approved. Pt aware.   The Prior Authorization request has been approved for Linzess 145MCG OR CAPS. The authorization is valid from 01/06/2020 through 02/04/2021. A letter of explanation will also be mailed to the patient.  Nothing further needed.

## 2020-02-05 NOTE — Telephone Encounter (Signed)
Linzess approved through insurance  The Prior Authorization request has been approved for Linzess 145MCG OR CAPS. The authorization is valid from 01/06/2020 through 02/04/2021. A letter of explanation will also be mailed to the patient.  Mychart message sent back to the patient making her aware. Nothing further needed.

## 2020-02-05 NOTE — Telephone Encounter (Signed)
Noted  

## 2020-04-04 ENCOUNTER — Other Ambulatory Visit: Payer: Self-pay | Admitting: Pulmonary Disease

## 2020-04-04 NOTE — Telephone Encounter (Signed)
Dr. Halford Chessman, please advise if you are okay refilling med.

## 2020-04-18 ENCOUNTER — Other Ambulatory Visit: Payer: Self-pay | Admitting: Family Medicine

## 2020-04-18 DIAGNOSIS — Z1231 Encounter for screening mammogram for malignant neoplasm of breast: Secondary | ICD-10-CM

## 2020-05-19 ENCOUNTER — Other Ambulatory Visit: Payer: Self-pay | Admitting: Family Medicine

## 2020-05-19 DIAGNOSIS — Z1159 Encounter for screening for other viral diseases: Secondary | ICD-10-CM

## 2020-05-19 DIAGNOSIS — E6609 Other obesity due to excess calories: Secondary | ICD-10-CM

## 2020-05-19 DIAGNOSIS — R7303 Prediabetes: Secondary | ICD-10-CM

## 2020-05-19 DIAGNOSIS — I10 Essential (primary) hypertension: Secondary | ICD-10-CM

## 2020-05-19 DIAGNOSIS — Z6836 Body mass index (BMI) 36.0-36.9, adult: Secondary | ICD-10-CM

## 2020-05-19 DIAGNOSIS — Z114 Encounter for screening for human immunodeficiency virus [HIV]: Secondary | ICD-10-CM

## 2020-05-22 ENCOUNTER — Other Ambulatory Visit: Payer: Self-pay

## 2020-05-22 ENCOUNTER — Other Ambulatory Visit (INDEPENDENT_AMBULATORY_CARE_PROVIDER_SITE_OTHER): Payer: Federal, State, Local not specified - PPO

## 2020-05-22 DIAGNOSIS — R7303 Prediabetes: Secondary | ICD-10-CM | POA: Diagnosis not present

## 2020-05-22 DIAGNOSIS — Z6836 Body mass index (BMI) 36.0-36.9, adult: Secondary | ICD-10-CM

## 2020-05-22 DIAGNOSIS — I10 Essential (primary) hypertension: Secondary | ICD-10-CM

## 2020-05-22 DIAGNOSIS — E6609 Other obesity due to excess calories: Secondary | ICD-10-CM

## 2020-05-22 DIAGNOSIS — Z1159 Encounter for screening for other viral diseases: Secondary | ICD-10-CM | POA: Diagnosis not present

## 2020-05-22 DIAGNOSIS — Z114 Encounter for screening for human immunodeficiency virus [HIV]: Secondary | ICD-10-CM

## 2020-05-22 LAB — LIPID PANEL
Cholesterol: 151 mg/dL (ref 0–200)
HDL: 45.3 mg/dL (ref >39.00)
NonHDL: 105.26
Total CHOL/HDL Ratio: 3
Triglycerides: 204 mg/dL — ABNORMAL HIGH (ref 0.0–149.0)
VLDL: 40.8 mg/dL — ABNORMAL HIGH (ref 0.0–40.0)

## 2020-05-22 LAB — LDL CHOLESTEROL, DIRECT: Direct LDL: 81 mg/dL

## 2020-05-22 LAB — HEMOGLOBIN A1C: Hgb A1c MFr Bld: 5.9 % (ref 4.6–6.5)

## 2020-05-23 LAB — HIV ANTIBODY (ROUTINE TESTING W REFLEX): HIV 1&2 Ab, 4th Generation: NONREACTIVE

## 2020-05-23 LAB — HEPATITIS C ANTIBODY
Hepatitis C Ab: NONREACTIVE
SIGNAL TO CUT-OFF: 0.02 (ref <1.00)

## 2020-05-29 ENCOUNTER — Other Ambulatory Visit: Payer: Self-pay

## 2020-05-29 ENCOUNTER — Ambulatory Visit (INDEPENDENT_AMBULATORY_CARE_PROVIDER_SITE_OTHER): Payer: Federal, State, Local not specified - PPO | Admitting: Family Medicine

## 2020-05-29 VITALS — BP 132/82 | HR 101 | Temp 97.9°F | Ht 63.75 in | Wt 206.0 lb

## 2020-05-29 DIAGNOSIS — R7303 Prediabetes: Secondary | ICD-10-CM

## 2020-05-29 DIAGNOSIS — Z0000 Encounter for general adult medical examination without abnormal findings: Secondary | ICD-10-CM

## 2020-05-29 DIAGNOSIS — G4733 Obstructive sleep apnea (adult) (pediatric): Secondary | ICD-10-CM

## 2020-05-29 DIAGNOSIS — Z23 Encounter for immunization: Secondary | ICD-10-CM

## 2020-05-29 DIAGNOSIS — I10 Essential (primary) hypertension: Secondary | ICD-10-CM

## 2020-05-29 DIAGNOSIS — E6609 Other obesity due to excess calories: Secondary | ICD-10-CM | POA: Diagnosis not present

## 2020-05-29 DIAGNOSIS — Z6836 Body mass index (BMI) 36.0-36.9, adult: Secondary | ICD-10-CM

## 2020-05-29 NOTE — Progress Notes (Signed)
Subjective:    Patient ID: Diane Townsend, female    DOB: [birth year], 44 y.o.   MRN: [id number]  HPI Chief Complaint  Patient presents with  . Annual Exam   This is a 44 yo female who presents today for annual exam. High stress, husband is still recovering from Covid.  He is completely dependent on her for many of his daily needs.  She has a little help from her parents.  Last CPE- 05/29/2019 Mammo- annual, upcoming Pap-08/25/2017, gyn Colonoscopy- never Tdap- 05/23/2018 Flu- annual Covid 19 vaccine-fully vaccinated Eye-within last 2 years Dental-regular Exercise-very little Diet--has been difficult for her to focus on herself including making better nutrition choices.  She has increased her water intake and decreased her lemonade consumption.  She typically eats 1 meal a day, tries to incorporate some vegetables.  Usually has a protein.     Review of Systems  Constitutional: Positive for fatigue.  HENT: Negative.   Eyes: Negative.   Respiratory: Negative.   Cardiovascular: Negative.   Gastrointestinal: Positive for constipation (chronic, managing with occasional linzess).  Endocrine: Negative.   Genitourinary: Negative.   Musculoskeletal: Negative.   Skin: Negative.   Allergic/Immunologic: Negative.   Neurological: Negative.   Hematological: Negative.   Psychiatric/Behavioral: Positive for sleep disturbance (husband wakes her frequently through the night).       Objective:   Physical Exam Physical Exam  Constitutional: She is oriented to person, place, and time. She appears well-developed and well-nourished. No distress.  HENT:  Head: Normocephalic and atraumatic.  Right Ear: External ear normal. TM normal.  Left Ear: External ear normal. TM normal.  Nose: Nose normal.  Mouth/Throat: Oropharynx is clear and moist. No oropharyngeal exudate.  Eyes: Conjunctivae are normal.   Neck: Normal range of motion. Neck supple. No JVD present. No thyromegaly present.   Cardiovascular: Normal rate, regular rhythm, normal heart sounds and intact distal pulses.   Pulmonary/Chest: Effort normal and breath sounds normal. Right breast exhibits no inverted nipple, no mass, no nipple discharge, no skin change and no tenderness. Left breast exhibits no inverted nipple, no mass, no nipple discharge, no skin change and no tenderness. Breasts are symmetrical.  Abdominal: Soft. Bowel sounds are normal. She exhibits no distension and no mass. There is no tenderness. There is no rebound and no guarding.  Musculoskeletal: Normal range of motion. She exhibits no edema or tenderness.  Lymphadenopathy:    She has no cervical adenopathy.  Neurological: She is alert and oriented to person, place, and time.   Skin: Skin is warm and dry. She is not diaphoretic.  Psychiatric: She has a normal mood and affect. Her behavior is normal. Judgment and thought content normal.  Vitals reviewed.     BP 132/82   Pulse (!) 101   Temp 97.9 F (36.6 C) (Temporal)   Ht 5' 3.75" (1.619 m)   Wt 206 lb (93.4 kg)   SpO2 98%   BMI 35.64 kg/m  Wt Readings from Last 3 Encounters:  05/29/20 206 lb (93.4 kg)  01/05/20 208 lb 12.8 oz (94.7 kg)  12/01/19 210 lb 15.7 oz (95.7 kg)   Depression screen [facility] 2/9 05/29/2019 05/23/2018 09/10/2017  Decreased Interest 0 0 0  Down, Depressed, Hopeless 0 0 0  PHQ - 2 Score 0 0 0   Depression screen [facility] 2/9 05/29/2020 05/29/2019 05/23/2018 09/10/2017  Decreased Interest 1 0 0 0  Down, Depressed, Hopeless 1 0 0 0  PHQ - 2 Score 2 0 0 0  Altered sleeping 1 - - -  Tired, decreased energy 2 - - -  Change in appetite 0 - - -  Feeling bad or failure about yourself  0 - - -  Trouble concentrating 0 - - -  Moving slowly or fidgety/restless 0 - - -  Suicidal thoughts 0 - - -  PHQ-9 Score 5 - - -  Difficult doing work/chores Somewhat difficult - - -       Assessment & Plan:  1. Annual physical exam - Discussed and encouraged healthy lifestyle choices-  adequate sleep, regular exercise, stress management and healthy food choices.    2. Need for influenza vaccination - Flu Vaccine QUAD 6+ mos PF IM (Fluarix Quad PF)  3. Obstructive sleep apnea syndrome - continued follow up with pulmonary  4. Essential hypertension -Controlled on amlodipine 5 mg  5. Class 2 obesity due to excess calories without serious comorbidity with body mass index (BMI) of 36.0 to 36.9 in adult -Weight down a little, encouraged patient and provided written and verbal information regarding healthy diet, effects of stress and sleep on attempts at weight loss  6. Prediabetes -Per #5 -Follow-up in 6 months  This visit occurred during the SARS-CoV-2 public health emergency.  Safety protocols were in place, including screening questions prior to the visit, additional usage of staff PPE, and extensive cleaning of exam room while observing appropriate contact time as indicated for disinfecting solutions.      Clarene Reamer, FNP-BC  [facility] at [facility]  05/30/2020 3:13 PM

## 2020-05-29 NOTE — Patient Instructions (Signed)
So good to see you today!  Please take care of yourself with everything you have going on  There is not one right eating plan for everyone.  It may take trial and error to find what will work for you.  It is important to get adequate protein and fiber with your meals.  It is okay to not eat breakfast or to skip meals if you are not hungry.  Avoid snacking between meals.  Unless you are on a fluid restriction, drink 80 to 90 ounces of water a day.  Suggested resources- www.dietdoctor.com/diabetes/diet www.adaptyourlifeacademy.com-there is a quiz to help you determine how many carbohydrates you should eat a day  www.thefastingmethod.com  If you have diabetes or access to a blood sugar machine, I recommend you check your blood sugar daily and keep a log.  Vary the time you check your blood sugar such as fasting, before meal, 2 hours after a meal and at bedtime.  Look for trends with the foods you are eating and be a scientist of your body.  Here are some guidelines to help you with meal planning -  Avoid all processed and packaged foods (bread, pasta, crackers, chips, etc) and beverages containing calories.  Avoid added sugars and excessive natural sugars.  Pay attention to how you feel if you consume artificial sweeteners.  Do they make you more hungry or raise your blood sugar?  With every meal and snack, aim to get 20 g of protein (3 ounces of meat, 4 ounces of fish, 3 eggs, protein powder, 1 cup [place] yogurt, 1 cup cottage cheese, etc.)  Increase fiber in the form of non-starchy vegetables.  These help you feel full with very little carbohydrates and are good for gut health.  Nonstarchy vegetables include summer squash, onions, peppers, tomatoes, eggplant, broccoli, cauliflower, cabbage, lettuce, spinach.  Have small amounts of good fats such as avocado, nuts, olive oil, nut butters, olives.  Add a little cheese to your meals to make them tasty.   Try to plan your meals for the week and do  some meal preparation when able.  If possible, make lunches for the week ahead of time.  Plan a couple of dinners and make enough so you can have leftovers.  Build in a treat once a week.

## 2020-05-30 ENCOUNTER — Encounter: Payer: Self-pay | Admitting: Family Medicine

## 2020-06-03 ENCOUNTER — Other Ambulatory Visit: Payer: Self-pay

## 2020-06-03 ENCOUNTER — Ambulatory Visit
Admission: RE | Admit: 2020-06-03 | Discharge: 2020-06-03 | Disposition: A | Discharge status: Home / Self Care | Payer: Federal, State, Local not specified - PPO | Source: Ambulatory Visit | Attending: Family Medicine | Admitting: Family Medicine

## 2020-06-03 DIAGNOSIS — Z1231 Encounter for screening mammogram for malignant neoplasm of breast: Secondary | ICD-10-CM | POA: Diagnosis not present

## 2020-07-04 ENCOUNTER — Other Ambulatory Visit: Payer: Self-pay | Admitting: Pulmonary Disease

## 2020-07-04 NOTE — Telephone Encounter (Signed)
Dr. Halford Chessman, please advise if you are okay refilling med.

## 2020-07-10 ENCOUNTER — Ambulatory Visit: Payer: Federal, State, Local not specified - PPO | Admitting: Family Medicine

## 2020-07-10 ENCOUNTER — Encounter: Payer: Self-pay | Admitting: Family Medicine

## 2020-07-10 ENCOUNTER — Ambulatory Visit (INDEPENDENT_AMBULATORY_CARE_PROVIDER_SITE_OTHER)
Admission: RE | Admit: 2020-07-10 | Discharge: 2020-07-10 | Disposition: A | Discharge status: Home / Self Care | Payer: Federal, State, Local not specified - PPO | Source: Ambulatory Visit | Attending: Family Medicine | Admitting: Family Medicine

## 2020-07-10 ENCOUNTER — Other Ambulatory Visit: Payer: Self-pay

## 2020-07-10 VITALS — BP 120/80 | HR 91 | Temp 97.2°F | Ht 63.75 in | Wt 206.8 lb

## 2020-07-10 DIAGNOSIS — Z043 Encounter for examination and observation following other accident: Secondary | ICD-10-CM | POA: Diagnosis not present

## 2020-07-10 DIAGNOSIS — M25572 Pain in left ankle and joints of left foot: Secondary | ICD-10-CM

## 2020-07-10 DIAGNOSIS — S93422A Sprain of deltoid ligament of left ankle, initial encounter: Secondary | ICD-10-CM | POA: Diagnosis not present

## 2020-07-10 DIAGNOSIS — S8252XA Displaced fracture of medial malleolus of left tibia, initial encounter for closed fracture: Secondary | ICD-10-CM

## 2020-07-10 NOTE — Progress Notes (Signed)
Kortney Schoenfelder T. Dilana Mcphie, MD, [place]  [facility] and [place] at [facility] [place], [address]  Phone: [phone]  FAX: [phone]  Diane Townsend - 44 y.o. female  MRN [id number]  Date of Birth: 07-Apr-1976  Date: 07/10/2020  PCP: Elby Beck, FNP  Referral: Elby Beck, FNP  Chief Complaint  Patient presents with  . Foot Pain    Left-Foot went through attic 2 days before Thanksgiving    This visit occurred during the SARS-CoV-2 public health emergency.  Safety protocols were in place, including screening questions prior to the visit, additional usage of staff PPE, and extensive cleaning of exam room while observing appropriate contact time as indicated for disinfecting solutions.   Subjective:   Diane Townsend is a 44 y.o. very pleasant female patient with Body mass index is 35.77 kg/m. who presents with the following:  L foot, went through attack towards the end of November. She was trying to get a suitcase out of the attic, and when she stepped down her leg went through the ceiling and tore large part out of the ceiling in her house. She did have some significant swelling initially, she has had some continued initial ongoing swelling. She never really had any significant bruising.  She has had some medial ankle pain throughout this time. At first she thought that this was going to resolve on her own, and she is here today for her first medical evaluation.  It aches off and on, there are some days but she doesn't even really hurt all that much.  She has no pain in the foot and has no significant pain in the lateral ankle or in the proximal lower extremity.  Review of Systems is noted in the HPI, as appropriate   Objective:   BP 120/80   Pulse 91   Temp (!) 97.2 F (36.2 C) (Temporal)   Ht 5' 3.75" (1.619 m)   Wt 206 lb 12 oz (93.8 kg)   SpO2 99%   BMI 35.77 kg/m   Foot and ankle:  Left side: She has no tenderness in the phalanges or the entirety of the midfoot. Calcaneus is nontender, navicular nontender. All cuneiforms and cuboid are nontender. Talus is nontender. Lateral malleolus is nontender.  She is tender at the maximum at the tip of the medial malleolus. She also has some tenderness inferior to this. There is no bruising. Minimal swelling.  Radiology: No results found.  Assessment and Plan:     ICD-10-CM   1. Sprain of deltoid ligament of left ankle, initial encounter  S93.422A   2. Acute left ankle pain  M25.572 DG Ankle Complete Left  3. Avulsion fracture of medial malleolus, left, closed, initial encounter  S82.52XA    I think that the primary pain driver here is a deltoid ligament sprain. I'm going to place her in an Aircast with follow-up in 3 to 4 weeks. Basic range of motion home rehab are reviewed.  On my review, on the AP there is a very tiny radiopacity seen at the distal most aspect of the medial malleolus. Given that this is the area of maximal pain, I think that the patient has had a tiny avulsion in this region as well.  Aircast will offer stability in this region as well while allowing plantar flexion and dorsiflexion.  Social: This is limiting her ability to walk and get around as well as get some physical exercise.  Orders Placed This Encounter  Procedures  . DG Ankle Complete Left    Follow-up: Return in about 4 weeks (around 08/07/2020).  Signed,  Maud Deed. Scheryl Sanborn, MD   Outpatient Encounter Medications as of 07/10/2020  Medication Sig  . amLODipine (NORVASC) 5 MG tablet TAKE 1 TABLET(5 MG) BY MOUTH DAILY  . Armodafinil 150 MG tablet TAKE 1 TABLET(150 MG) BY MOUTH DAILY  . Cholecalciferol (VITAMIN D3 PO) Vitamin D3  . linaclotide (LINZESS) 145 MCG CAPS capsule Take 1 capsule (145 mcg total) by mouth daily before breakfast.  . Multiple Vitamins-Minerals (MULTIVITAMIN ADULT PO) Vitamin-Mineral Supplement oral liquid  . Omega-3  Fatty Acids (FISH OIL PO) Take by mouth.  . SUNOSI 75 MG TABS Take 0.5 tablets by mouth daily.   No facility-administered encounter medications on file as of 07/10/2020.

## 2020-07-30 ENCOUNTER — Telehealth: Payer: Self-pay

## 2020-07-30 DIAGNOSIS — K5900 Constipation, unspecified: Secondary | ICD-10-CM

## 2020-07-30 MED ORDER — LINACLOTIDE 145 MCG PO CAPS: 145.0000 ug | ORAL_CAPSULE | Freq: Every day | ORAL | 5 refills | Status: DC

## 2020-07-30 MED ORDER — AMLODIPINE BESYLATE 5 MG PO TABS
ORAL_TABLET | ORAL | 1 refills | Status: DC
Start: 2020-07-30 — End: 2021-01-26

## 2020-07-30 NOTE — Telephone Encounter (Signed)
Pharmacy requests refill on: Linaclotide 145 mcg    LAST REFILL: 01/17/2020 (Q-30, R-5) LAST OV: 07/10/2020 NEXT OV: Not Scheduled  PHARMACY: [place], [place]

## 2020-07-30 NOTE — Telephone Encounter (Signed)
Pharmacy requests refill on: Amlodipine 5 mg   LAST REFILL: 01/31/2020 (Q-90, R-1) LAST OV: 07/10/2020 NEXT OV: Not Scheduled  PHARMACY: [place], [place]

## 2020-12-24 ENCOUNTER — Other Ambulatory Visit: Payer: Self-pay | Admitting: Pulmonary Disease

## 2020-12-25 ENCOUNTER — Telehealth: Payer: Self-pay

## 2020-12-25 NOTE — Telephone Encounter (Signed)
LVM in regards to rx request for Armodafinil, because it  has been over a year since pt last seen, pt will need an office visit to be able to receive any other refills.

## 2020-12-27 ENCOUNTER — Other Ambulatory Visit: Payer: Self-pay

## 2020-12-27 ENCOUNTER — Ambulatory Visit: Payer: Federal, State, Local not specified - PPO | Admitting: Primary Care

## 2020-12-27 ENCOUNTER — Encounter: Payer: Self-pay | Admitting: Primary Care

## 2020-12-27 VITALS — BP 126/76 | HR 87 | Temp 97.7°F | Ht 64.0 in | Wt 205.2 lb

## 2020-12-27 DIAGNOSIS — G4733 Obstructive sleep apnea (adult) (pediatric): Secondary | ICD-10-CM

## 2020-12-27 DIAGNOSIS — R400 Somnolence: Secondary | ICD-10-CM

## 2020-12-27 MED ORDER — ARMODAFINIL 150 MG PO TABS: ORAL_TABLET | ORAL | 5 refills | Status: DC

## 2020-12-27 NOTE — Assessment & Plan Note (Signed)
-   Patient is 100% compliant with CPAP - Pressure 13cm h20; AHI 2.3 - No changes today - Encourage weight loss efforts and advised against driving if experiencing excessive daytime fatigue

## 2020-12-27 NOTE — Assessment & Plan Note (Signed)
-   Patient was experiencing excessive daytime sleepiness despite compliance with CPAP. She has been taking Armodafinil 150mg  daily in the morning with significant improvement. Epworth 3/40. PMP reviewed, no unexpected prescriptions found. VSS. Refill provided, reviewed safe use/precautions.

## 2020-12-27 NOTE — Progress Notes (Signed)
Reviewed and agree with assessment/plan.   Chesley Mires, MD [facility] Pulmonary/Critical Care 12/27/2020, 2:31 PM Pager:  [phone]

## 2020-12-27 NOTE — Patient Instructions (Signed)
Recommendations: - Continue to use CPAP every night for 4-6 hours  - Take Armodafinil 150mg  once daily in the morning - Continue to work on weight loss efforts - Get some physical exercise and early morning sunlight to help with fatigue - Do not drive if excessively tired   Orders: - Renew CPAP supplies with [employer]   Rx: -Armodafinil 150mg  once daily    Follow-up: - 1 year with Dr. Halford Chessman or Eustaquio Maize NP

## 2020-12-27 NOTE — Progress Notes (Signed)
@Patient  ID: Diane Townsend, female    DOB: 1975-10-04, 45 y.o.   MRN: [id number]  Chief Complaint  Patient presents with   Follow-up    Refill on armodafinil    Referring provider: No ref. provider found  HPI: 45 year old female, never smoked.  Past medical history significant for OSA, pretension, obesity.  Patient of Dr. Halford Chessman, last seen by pulmonary nurse practitioner on 10/18/2019.  She is maintained on CPAP at 13 cm H2O.   12/27/2020 Patient presents today for 1 year follow-up OSA/daytime sleepiness.  She is doing very well. No acute complaints. No issues with pressure setting or mask fit. She is 100% compliant with CPAP, using on average 8 hours and 42 minutes a night.  CPAP pressure 13 cm H2O, residual AHI 2.3. She takes Armodafinil 150mg  daily. Epworth score 3/40. She is going through some adjustments at home, her husband was recently [facility] in [facility] d/t his past stroke.   Airview download 11/25/20-12/24/20: 30/30 days (100%); 30 days (100%) > 4 hours Average usage 8 hours 42 mins Pressure 13 cm h20 Airleaks 2.9L/min (95%) AHI 2.3    No Known Allergies  Immunization History  Administered Date(s) Administered   Influenza Inj Mdck Quad With Preservative 05/29/2019   Influenza,inj,Quad PF,6+ Mos 05/23/2018, 05/29/2020   Influenza,inj,quad, With Preservative 05/28/2016   Pneumococcal Polysaccharide-23 07/20/2008   Tdap 05/23/2018    Past Medical History:  Diagnosis Date   Hypertensive disorder 05/09/2015   IBS (irritable bowel syndrome)    Obstructive sleep apnea syndrome 04/26/2014    Tobacco History: Social History   Tobacco Use  Smoking Status Never  Smokeless Tobacco Never   Counseling given: Not Answered   Outpatient Medications Prior to Visit  Medication Sig Dispense Refill   amLODipine (NORVASC) 5 MG tablet TAKE 1 TABLET(5 MG) BY MOUTH DAILY 90 tablet 1   Cholecalciferol (VITAMIN D3 PO) Vitamin D3     linaclotide (LINZESS) 145 MCG CAPS  capsule Take 1 capsule (145 mcg total) by mouth daily before breakfast. 30 capsule 5   Multiple Vitamins-Minerals (MULTIVITAMIN ADULT PO) Vitamin-Mineral Supplement oral liquid     Omega-3 Fatty Acids (FISH OIL PO) Take by mouth.     Armodafinil 150 MG tablet TAKE 1 TABLET(150 MG) BY MOUTH DAILY 30 tablet 5   SUNOSI 75 MG TABS Take 0.5 tablets by mouth daily. (Patient not taking: Reported on 12/27/2020)     No facility-administered medications prior to visit.    Review of Systems  Review of Systems  Constitutional: Negative.   HENT: Negative.    Respiratory: Negative.    Cardiovascular: Negative.   Psychiatric/Behavioral: Negative.      Physical Exam  BP 126/76 (BP Location: Left Arm, Patient Position: Sitting, Cuff Size: Normal)   Pulse 87   Temp 97.7 F (36.5 C) (Temporal)   Ht 5\' 4"  (1.626 m)   Wt 205 lb 3.2 oz (93.1 kg)   SpO2 99% Comment: RA  BMI 35.22 kg/m  Physical Exam Constitutional:      Appearance: Normal appearance.  HENT:     Head: Normocephalic and atraumatic.     Mouth/Throat:     Mouth: Mucous membranes are moist.     Pharynx: Oropharynx is clear.  Cardiovascular:     Rate and Rhythm: Normal rate and regular rhythm.  Pulmonary:     Effort: Pulmonary effort is normal.     Breath sounds: Normal breath sounds.  Musculoskeletal:        General: Normal range  of motion.  Skin:    General: Skin is warm and dry.  Neurological:     General: No focal deficit present.     Mental Status: She is alert and oriented to person, place, and time. Mental status is at baseline.  Psychiatric:        Mood and Affect: Mood normal.        Behavior: Behavior normal.        Thought Content: Thought content normal.        Judgment: Judgment normal.     Lab Results:  CBC    Component Value Date/Time   WBC 4.5 12/01/2019 1459   RBC 5.15 12/01/2019 1459   HGB 13.8 12/01/2019 1459   HCT 41.8 12/01/2019 1459   PLT 290 12/01/2019 1459   MCV 81 12/01/2019 1459   MCH  26.8 12/01/2019 1459   MCHC 33.0 12/01/2019 1459   RDW 13.6 12/01/2019 1459    BMET    Component Value Date/Time   NA 142 12/01/2019 1459   K 3.9 12/01/2019 1459   CL 105 12/01/2019 1459   CO2 22 12/01/2019 1459   GLUCOSE 93 12/01/2019 1459   GLUCOSE 118 (H) 05/24/2019 0825   BUN 12 12/01/2019 1459   CREATININE 0.81 12/01/2019 1459   CALCIUM 9.7 12/01/2019 1459   GFRNONAA 89 12/01/2019 1459   GFRAA 102 12/01/2019 1459    BNP No results found for: BNP  ProBNP No results found for: PROBNP  Imaging: No results found.   Assessment & Plan:   Obstructive sleep apnea syndrome - Patient is 100% compliant with CPAP - Pressure 13cm h20; AHI 2.3 - No changes today - Encourage weight loss efforts and advised against driving if experiencing excessive daytime fatigue   Daytime sleepiness - Patient was experiencing excessive daytime sleepiness despite compliance with CPAP. She has been taking Armodafinil 150mg  daily in the morning with significant improvement. Epworth 3/40. PMP reviewed, no unexpected prescriptions found. VSS. Refill provided, reviewed safe use/precautions.    Martyn Ehrich, NP 12/27/2020

## 2020-12-31 ENCOUNTER — Telehealth: Payer: Self-pay | Admitting: Primary Care

## 2020-12-31 NOTE — Telephone Encounter (Signed)
PA request was received from (pharmacy): [facility] in [place] Fax:  Medication name and strength: armodafinil 150mg   Ordering Provider: Eustaquio Maize  Was PA started with CMM?: Yes If yes, please enter KEY: [id number] Medication tried and failed:  Covered Alternatives:   PA was instantly approved 12/01/20 to 12/31/21. Pharmacy is aware of the approval. Nothing further needed at time of call.

## 2021-01-02 NOTE — Telephone Encounter (Signed)
Created in error

## 2021-01-08 NOTE — Telephone Encounter (Signed)
Error

## 2021-01-24 ENCOUNTER — Other Ambulatory Visit: Payer: Self-pay | Admitting: Family Medicine

## 2021-01-24 ENCOUNTER — Telehealth: Payer: Self-pay

## 2021-01-24 NOTE — Telephone Encounter (Signed)
Refill request for Linzess 145 mcg caps  LOV - 07/10/20 Next OV - not scheduled Last refill - 07/30/20 #30/5

## 2021-01-26 ENCOUNTER — Other Ambulatory Visit: Payer: Self-pay | Admitting: Family

## 2021-01-26 DIAGNOSIS — K5900 Constipation, unspecified: Secondary | ICD-10-CM

## 2021-01-26 MED ORDER — LINACLOTIDE 145 MCG PO CAPS: 145.0000 ug | ORAL_CAPSULE | Freq: Every day | ORAL | 5 refills | Status: DC

## 2021-03-05 ENCOUNTER — Encounter: Payer: Self-pay | Admitting: Nurse Practitioner

## 2021-03-05 ENCOUNTER — Other Ambulatory Visit: Payer: Self-pay

## 2021-03-05 ENCOUNTER — Ambulatory Visit: Payer: Federal, State, Local not specified - PPO | Admitting: Nurse Practitioner

## 2021-03-05 ENCOUNTER — Telehealth: Payer: Self-pay

## 2021-03-05 DIAGNOSIS — K5904 Chronic idiopathic constipation: Secondary | ICD-10-CM | POA: Insufficient documentation

## 2021-03-05 NOTE — Telephone Encounter (Signed)
Spoke with patient today and she decided to stay with [place] and will due [place] with female NP in November

## 2021-03-05 NOTE — Patient Instructions (Signed)
Drinking plenty of water and increasing physical activity will help with your bowel motility. Follow up 3 months for your physical and labs. Please send me that approval letter via My Chart

## 2021-03-05 NOTE — Telephone Encounter (Signed)
PA for Linzess 145 mg dose approved. Dates: 02/03/21-03/05/22. Patient and the pharmacy advised

## 2021-03-05 NOTE — Progress Notes (Signed)
Established Patient Office Visit  Subjective:  Patient ID: Diane Townsend, female    DOB: 03-Aug-1975  Age: 45 y.o. MRN: [id number]  CC:  Chief Complaint  Patient presents with   Medication Management    Needs refill. Patient would like to [facility] to female NP in November    HPI Diane Townsend presents for medication refill  Constipation: Has been dealing with constipation for approx 10-15 years. She has tried mira-lax, ducolax, Zeelnorm. Was on lower dose of linzess but needed to supplement with OTC treatments. Has tried lactulose that was not hers, with good effect. On linzess she does go daily. Off linzess patient was able to move bowels once every 2-3 days. Drinks water and juice. Does not drink tea or coffee. Soft normal Bm on linzess. Belly pain when she is not on linzess and cannot move her bowels regularly.  Past Medical History:  Diagnosis Date   Hypertensive disorder 05/09/2015   IBS (irritable bowel syndrome)    Obstructive sleep apnea syndrome 04/26/2014    Past Surgical History:  Procedure Laterality Date   ABLATION ON ENDOMETRIOSIS     APPENDECTOMY  [place]  11/21/2015   Done for AUB, benign pathology. Ovaries still in place.   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 [place]  Tobacco Use   Smoking status: Never   Smokeless tobacco: Never   Vaping Use   Vaping Use: Never used  Substance and Sexual Activity   Alcohol use: No   Drug use: No   Sexual activity: Yes    Partners: Male    Birth control/protection: Surgical  Other Topics Concern   Not on file  Social History Narrative   Not on file   Social Determinants of Health   Financial Resource Strain: Not on file  Food Insecurity: Not on file  Transportation Needs: Not on file  Physical Activity: Not on file  Stress: Not on file  Social Connections: Not on file  Intimate Partner Violence: Not on file    Outpatient Medications Prior to Visit  Medication Sig Dispense Refill   amLODipine (NORVASC) 5 MG tablet TAKE 1 TABLET(5 MG) BY MOUTH DAILY 90 tablet 1   Armodafinil 150 MG tablet TAKE 1 TABLET(150 MG) BY MOUTH DAILY 30 tablet 5   linaclotide (LINZESS) 145 MCG CAPS capsule Take 1 capsule (145 mcg total) by mouth daily before breakfast. 30 capsule 5   Multiple Vitamins-Minerals (MULTIVITAMIN ADULT PO) Vitamin-Mineral Supplement oral liquid     Omega-3 Fatty Acids (FISH OIL PO) Take by mouth.     SUNOSI 75 MG TABS Take 0.5 tablets by mouth daily.     Cholecalciferol (VITAMIN D3 PO) Vitamin D3     No facility-administered medications prior to visit.  No Known Allergies  ROS Review of Systems  Constitutional:  Negative for chills and fever.  Respiratory:  Negative for shortness of breath.   Cardiovascular:  Negative for chest pain.  Gastrointestinal:  Positive for constipation. Negative for abdominal pain, blood in stool, diarrhea, nausea and vomiting.  Skin:  Negative for color change and pallor.     Objective:    Physical Exam Vitals and nursing note reviewed.  Constitutional:      Appearance: She is obese.  Cardiovascular:     Rate and Rhythm: Normal rate and regular rhythm.  Pulmonary:     Effort: Pulmonary effort is normal.     Breath sounds: Normal breath sounds.  Abdominal:     General: Bowel sounds are normal. There is no distension.      Palpations: There is no mass.     Tenderness: There is no abdominal tenderness.  Skin:    General: Skin is warm.  Neurological:     Mental Status: She is alert.  Psychiatric:        Mood and Affect: Mood normal.        Behavior: Behavior normal.        Thought Content: Thought content normal.        Judgment: Judgment normal.    BP 138/86   Pulse 88   Temp 98.1 F (36.7 C)   Resp 18   Ht 5' 3.75" (1.619 m)   Wt 206 lb 12 oz (93.8 kg)   SpO2 98%   BMI 35.77 kg/m  Wt Readings from Last 3 Encounters:  03/05/21 206 lb 12 oz (93.8 kg)  12/27/20 205 lb 3.2 oz (93.1 kg)  07/10/20 206 lb 12 oz (93.8 kg)     Health Maintenance Due  Topic Date Due   Pneumococcal Vaccine 21-31 Years old (2 - PCV) 07/20/2009   COVID-19 Vaccine (3 - Pfizer risk series) 12/14/2019   COLONOSCOPY (Pts 45-12yr Insurance coverage will need to be confirmed)  Never done   INFLUENZA VACCINE  02/17/2021    There are no preventive care reminders to display for this patient.  Lab Results  Component Value Date   TSH 1.910 08/26/2017   Lab Results  Component Value Date   WBC 4.5 12/01/2019   HGB 13.8 12/01/2019   HCT 41.8 12/01/2019   MCV 81 12/01/2019   PLT 290 12/01/2019   Lab Results  Component Value Date   NA 142 12/01/2019   K 3.9 12/01/2019   CO2 22 12/01/2019   GLUCOSE 93 12/01/2019   BUN 12 12/01/2019   CREATININE 0.81 12/01/2019   BILITOT 0.4 12/01/2019   ALKPHOS 55 12/01/2019   AST 25 12/01/2019   ALT 33 (H) 12/01/2019   PROT 7.6 12/01/2019   ALBUMIN 4.4 12/01/2019   CALCIUM 9.7 12/01/2019   GFR 86.60 05/24/2019   Lab Results  Component Value Date   CHOL 151 05/22/2020   Lab Results  Component Value Date   HDL 45.30 05/22/2020   Lab Results  Component Value Date   LDLCALC 57 08/26/2017   Lab Results  Component Value Date   TRIG 204.0 (H) 05/22/2020   Lab Results  Component Value Date   CHOLHDL 3 05/22/2020   Lab Results  Component Value Date   HGBA1C 5.9  05/22/2020      Assessment & Plan:   Problem List Items Addressed This Visit       Digestive   Chronic idiopathic constipation    Patient states she  has trouble with constipation for approximately 10 to 15 years.  Has tried different regimens in the past several over-the-counter and a few prescription.  Last prescription was Zelnorm but that was pulled from the [place] market.  Since then she was on Linzess 72 mcg.  Had good effect for an extended period of time.  After which became ineffective she had a supplement with over-the-counter meds along with the Linzess 72 mcg.  Since then the up to the 145 mcg Linzess and patient has had no issues moving her bowels daily.  Did call pharmacy has prescriptions waiting they inform me that medication required PA gave appropriate fax number for PA to be faxed to office.  We will fill out PA once received and see if medication is approved.  Patient was made aware of process ahead.       No orders of the defined types were placed in this encounter.   Follow-up: Return in about 3 months (around 06/05/2021) for CPE and labs.   This visit occurred during the SARS-CoV-2 public health emergency.  Safety protocols were in place, including screening questions prior to the visit, additional usage of staff PPE, and extensive cleaning of exam room while observing appropriate contact time as indicated for disinfecting solutions.   Romilda Garret, NP

## 2021-03-05 NOTE — Assessment & Plan Note (Signed)
Patient states she has trouble with constipation for approximately 10 to 15 years.  Has tried different regimens in the past several over-the-counter and a few prescription.  Last prescription was Zelnorm but that was pulled from the [place] market.  Since then she was on Linzess 72 mcg.  Had good effect for an extended period of time.  After which became ineffective she had a supplement with over-the-counter meds along with the Linzess 72 mcg.  Since then the up to the 145 mcg Linzess and patient has had no issues moving her bowels daily.  Did call pharmacy has prescriptions waiting they inform me that medication required PA gave appropriate fax number for PA to be faxed to office.  We will fill out PA once received and see if medication is approved.  Patient was made aware of process ahead.

## 2021-05-23 ENCOUNTER — Other Ambulatory Visit: Payer: Self-pay | Admitting: Family

## 2021-06-02 ENCOUNTER — Ambulatory Visit (INDEPENDENT_AMBULATORY_CARE_PROVIDER_SITE_OTHER): Payer: Federal, State, Local not specified - PPO | Admitting: Nurse Practitioner

## 2021-06-02 ENCOUNTER — Encounter: Payer: Self-pay | Admitting: Nurse Practitioner

## 2021-06-02 ENCOUNTER — Other Ambulatory Visit: Payer: Self-pay

## 2021-06-02 VITALS — BP 130/76 | HR 97 | Temp 98.5°F | Ht 63.5 in | Wt 209.0 lb

## 2021-06-02 DIAGNOSIS — Z1211 Encounter for screening for malignant neoplasm of colon: Secondary | ICD-10-CM

## 2021-06-02 DIAGNOSIS — E049 Nontoxic goiter, unspecified: Secondary | ICD-10-CM | POA: Diagnosis not present

## 2021-06-02 DIAGNOSIS — Z23 Encounter for immunization: Secondary | ICD-10-CM

## 2021-06-02 DIAGNOSIS — K5904 Chronic idiopathic constipation: Secondary | ICD-10-CM

## 2021-06-02 DIAGNOSIS — H6123 Impacted cerumen, bilateral: Secondary | ICD-10-CM | POA: Diagnosis not present

## 2021-06-02 DIAGNOSIS — Z1231 Encounter for screening mammogram for malignant neoplasm of breast: Secondary | ICD-10-CM | POA: Diagnosis not present

## 2021-06-02 DIAGNOSIS — I10 Essential (primary) hypertension: Secondary | ICD-10-CM

## 2021-06-02 DIAGNOSIS — Z0000 Encounter for general adult medical examination without abnormal findings: Secondary | ICD-10-CM | POA: Diagnosis not present

## 2021-06-02 DIAGNOSIS — G4733 Obstructive sleep apnea (adult) (pediatric): Secondary | ICD-10-CM

## 2021-06-02 LAB — COMPREHENSIVE METABOLIC PANEL
ALT: 31 U/L (ref 0–35)
AST: 27 U/L (ref 0–37)
Albumin: 4.5 g/dL (ref 3.5–5.2)
Alkaline Phosphatase: 44 U/L (ref 39–117)
BUN: 14 mg/dL (ref 6–23)
CO2: 30 mEq/L (ref 19–32)
Calcium: 9.6 mg/dL (ref 8.4–10.5)
Chloride: 106 mEq/L (ref 96–112)
Creatinine, Ser: 0.81 mg/dL (ref 0.40–1.20)
GFR: 87.09 mL/min (ref >60.00)
Glucose, Bld: 106 mg/dL — ABNORMAL HIGH (ref 70–99)
Potassium: 3.9 mEq/L (ref 3.5–5.1)
Sodium: 142 mEq/L (ref 135–145)
Total Bilirubin: 0.4 mg/dL (ref 0.2–1.2)
Total Protein: 7.3 g/dL (ref 6.0–8.3)

## 2021-06-02 LAB — CBC
HCT: 38.6 % (ref 36.0–46.0)
Hemoglobin: 13 g/dL (ref 12.0–15.0)
MCHC: 33.7 g/dL (ref 30.0–36.0)
MCV: 80.9 fl (ref 78.0–100.0)
Platelets: 300 10*3/uL (ref 150.0–400.0)
RBC: 4.77 Mil/uL (ref 3.87–5.11)
RDW: 13.8 % (ref 11.5–15.5)
WBC: 4.3 10*3/uL (ref 4.0–10.5)

## 2021-06-02 LAB — TSH: TSH: 1.57 u[IU]/mL (ref 0.35–5.50)

## 2021-06-02 LAB — LIPID PANEL
Cholesterol: 134 mg/dL (ref 0–200)
HDL: 41.4 mg/dL (ref >39.00)
LDL Cholesterol: 69 mg/dL (ref 0–99)
NonHDL: 92.8
Total CHOL/HDL Ratio: 3
Triglycerides: 118 mg/dL (ref 0.0–149.0)
VLDL: 23.6 mg/dL (ref 0.0–40.0)

## 2021-06-02 LAB — HEMOGLOBIN A1C: Hgb A1c MFr Bld: 6.1 % (ref 4.6–6.5)

## 2021-06-02 NOTE — Assessment & Plan Note (Signed)
History of the same management Linzess 145 mcg.  Patient still doing well with medication continue

## 2021-06-02 NOTE — Patient Instructions (Signed)
Nice to see you today Will see you in a year, sooner if you need me The labs will come over to my chart before I see them but I will attach a comment in approx 48 hours

## 2021-06-02 NOTE — Progress Notes (Signed)
Established Patient Office Visit  Subjective:  Patient ID: Diane Townsend, female    DOB: 06/14/1976  Age: 45 y.o. MRN: [id number]  CC:  Chief Complaint  Patient presents with   Annual Exam    Fasting     HPI Diane Townsend presents for complete physical and follow up of chronic conditions.  Immunizations: -Tetanus: This year at work also in 2019 -Influenza: 06/02/2021 -Covid-19: pfizer x 2 -Shingles: NA -Pneumonia:  NA  -HPV: NA  Diet: Fair diet. Variety of food choices. 1 good meal a day between work and being primary caregiver to her husband. 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 2021 caught covid from a skilled nursing facilty. Was gone for 5 months States that June 2021 to June 2022 was a rough year. Exercise: No regular exercise.   Eye exam:  several years ago   Dental exam: Completes semi-annually   Pap Smear:   hysterectomy, partial. Ovaries intact. Mammogram: Completed in 06/03/2020 Dexa: Completed in NA Colonoscopy: order today   Lung Cancer Screening: NA  Past Medical History:  Diagnosis Date   Hypertensive disorder 05/09/2015   IBS (irritable bowel syndrome)    Obstructive sleep apnea syndrome 04/26/2014    Past Surgical History:  Procedure Laterality Date   ABLATION ON ENDOMETRIOSIS     APPENDECTOMY  [place]  11/21/2015   Done for AUB, benign pathology. Ovaries still in place.   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 [place]  Tobacco Use   Smoking status: Never   Smokeless tobacco: Never  Vaping Use   Vaping Use: Never used  Substance and Sexual Activity   Alcohol use: No   Drug use: No   Sexual activity: Yes    Partners: Male    Birth control/protection: Surgical  Other Topics Concern   Not on file  Social History Narrative   Not on file   Social Determinants of Health   Financial Resource Strain: Not on file  Food Insecurity: Not on file  Transportation Needs: Not on file  Physical Activity: Not on file  Stress: Not on file  Social Connections: Not on file  Intimate Partner Violence: Not on file    Outpatient Medications Prior to Visit  Medication Sig Dispense Refill   amLODipine (NORVASC) 5 MG tablet TAKE 1 TABLET(5 MG) BY MOUTH DAILY 270 tablet 0   Armodafinil 150 MG tablet TAKE 1 TABLET(150 MG) BY MOUTH DAILY 30 tablet 5   linaclotide (LINZESS) 145 MCG CAPS capsule Take 1 capsule (145 mcg total) by mouth daily before breakfast. 30 capsule 5   Multiple Vitamins-Minerals (MULTIVITAMIN ADULT PO) Vitamin-Mineral Supplement oral liquid     Omega-3 Fatty Acids (FISH OIL PO) Take by mouth.     SUNOSI 75 MG TABS Take  0.5 tablets by mouth daily. (Patient not taking: Reported on 06/02/2021)     No facility-administered medications prior to visit.    No Known Allergies  ROS Review of Systems  Constitutional:  Negative for chills and fever.  Respiratory:  Negative for cough and shortness of breath.   Cardiovascular:  Negative for chest pain, palpitations and leg swelling.  Gastrointestinal:  Positive for constipation (managed on linzess. Daily BM with). Negative for diarrhea, nausea and vomiting.  Genitourinary:  Negative for dysuria, hematuria, vaginal bleeding, vaginal discharge and vaginal pain.  Neurological:  Negative for dizziness, light-headedness, numbness and headaches.   Psychiatric/Behavioral:  Negative for hallucinations and suicidal ideas.      Objective:    Physical Exam Constitutional:      Appearance: She is obese.  HENT:     Right Ear: Ear canal and external ear normal. There is impacted cerumen.     Left Ear: Ear canal and external ear normal. There is impacted cerumen.     Ears:     Comments: Verbal consent obtained from patient.  Per office protocol ear wax softening drops were installed in bilateral ears.  Then per office policy/protocol mixture of water and hydrogen peroxide placed in the bottle patient's ears irrigated until impaction removed.  Patient tolerated procedure well.  TMs within normal limits after disimpaction. Neck:     Thyroid: Thyromegaly present. No thyroid mass or thyroid tenderness.  Cardiovascular:     Rate and Rhythm: Normal rate and regular rhythm.  Pulmonary:     Effort: Pulmonary effort is normal.     Breath sounds: Normal breath sounds.  Abdominal:     General: Bowel sounds are normal. There is no distension.     Palpations: There is no mass.     Tenderness: There is no abdominal tenderness.  Musculoskeletal:     Right lower leg: No edema.     Left lower leg: No edema.  Lymphadenopathy:     Cervical: No cervical adenopathy.  Skin:    General: Skin is warm.     Capillary Refill: Capillary refill takes less than 2 seconds.  Neurological:     Mental Status: She is alert.     Motor: No weakness.     Coordination: Coordination normal.     Gait: Gait normal.     Deep Tendon Reflexes: Reflexes normal.     Reflex Scores:      Bicep reflexes are 2+ on the right side and 2+ on the left side.      Patellar reflexes are 2+ on the right side and 2+ on the left side. Psychiatric:        Mood and Affect: Mood normal.        Behavior: Behavior normal.        Thought Content: Thought content normal.        Judgment: Judgment normal.    BP 130/76   Pulse 97   Temp 98.5 F (36.9 C) (Temporal)   Ht 5' 3.5" (1.613  m)   Wt 209 lb (94.8 kg)   SpO2 97%   BMI 36.44 kg/m  Wt Readings from Last 3 Encounters:  06/02/21 209 lb (94.8 kg)  03/05/21 206 lb 12 oz (93.8 kg)  12/27/20 205 lb 3.2 oz (93.1 kg)     Health Maintenance Due  Topic Date Due   COLONOSCOPY (Pts 45-63yrs Insurance coverage will need to be confirmed)  Never done   INFLUENZA VACCINE  02/17/2021    There  are no preventive care reminders to display for this patient.  Lab Results  Component Value Date   TSH 1.910 08/26/2017   Lab Results  Component Value Date   WBC 4.5 12/01/2019   HGB 13.8 12/01/2019   HCT 41.8 12/01/2019   MCV 81 12/01/2019   PLT 290 12/01/2019   Lab Results  Component Value Date   NA 142 12/01/2019   K 3.9 12/01/2019   CO2 22 12/01/2019   GLUCOSE 93 12/01/2019   BUN 12 12/01/2019   CREATININE 0.81 12/01/2019   BILITOT 0.4 12/01/2019   ALKPHOS 55 12/01/2019   AST 25 12/01/2019   ALT 33 (H) 12/01/2019   PROT 7.6 12/01/2019   ALBUMIN 4.4 12/01/2019   CALCIUM 9.7 12/01/2019   GFR 86.60 05/24/2019   Lab Results  Component Value Date   CHOL 151 05/22/2020   Lab Results  Component Value Date   HDL 45.30 05/22/2020   Lab Results  Component Value Date   LDLCALC 57 08/26/2017   Lab Results  Component Value Date   TRIG 204.0 (H) 05/22/2020   Lab Results  Component Value Date   CHOLHDL 3 05/22/2020   Lab Results  Component Value Date   HGBA1C 5.9 05/22/2020      Assessment & Plan:   Problem List Items Addressed This Visit       Cardiovascular and Mediastinum   Essential hypertension    Patient currently maintained on amlodipine 5 mg for her high blood pressure.  Tolerating it well patient blood pressure within goal in office visit.  Patient does have the ability to check blood pressure at home states check checks it sparingly.  Continue take amlodipine 5 mg as prescribed and checking blood pressure intermittently at home.  Work on lifestyle modifications.        Respiratory    Obstructive sleep apnea syndrome    Managed by pulmonology.  Last note from that group she was 100% compliant with CPAP continue CPAP usage and follow-up as recommended by pulmonology group.        Digestive   Chronic idiopathic constipation    History of the same management Linzess 145 mcg.  Patient still doing well with medication continue        Nervous and Auditory   Impacted cerumen    Cerumen impaction was cleared after irrigation.      Relevant Orders   Ear Lavage     Other   Preventative health care - Primary    Discussed appropriate preventative screening exams and immunizations with patient today in office.  Did encourage her to work on lifestyle modifications including exercise.      Relevant Orders   CBC   Comprehensive metabolic panel   Hemoglobin A1c   Lipid panel   TSH   Other Visit Diagnoses     Need for immunization against influenza       Relevant Orders   Flu Vaccine QUAD 33mo+IM (Fluarix, Fluzone & Alfiuria Quad PF) (Completed)   Screening for colon cancer       Relevant Orders   Ambulatory referral to Gastroenterology   Encounter for screening mammogram for malignant neoplasm of breast       Relevant Orders   MM Digital Screening   Enlarged thyroid       Relevant Orders   US THYROID       No orders of the defined types were placed in this encounter.   Follow-up: Return in about 1 year (around  06/02/2022) for CPE.   This visit occurred during the SARS-CoV-2 public health emergency.  Safety protocols were in place, including screening questions prior to the visit, additional usage of staff PPE, and extensive cleaning of exam room while observing appropriate contact time as indicated for disinfecting solutions.   Romilda Garret, NP

## 2021-06-02 NOTE — Assessment & Plan Note (Signed)
Discussed appropriate preventative screening exams and immunizations with patient today in office.  Did encourage her to work on lifestyle modifications including exercise.

## 2021-06-02 NOTE — Assessment & Plan Note (Signed)
Managed by pulmonology.  Last note from that group she was 100% compliant with CPAP continue CPAP usage and follow-up as recommended by pulmonology group.

## 2021-06-02 NOTE — Assessment & Plan Note (Signed)
Patient currently maintained on amlodipine 5 mg for her high blood pressure.  Tolerating it well patient blood pressure within goal in office visit.  Patient does have the ability to check blood pressure at home states check checks it sparingly.  Continue take amlodipine 5 mg as prescribed and checking blood pressure intermittently at home.  Work on lifestyle modifications.

## 2021-06-02 NOTE — Assessment & Plan Note (Signed)
Cerumen impaction was cleared after irrigation.

## 2021-06-05 ENCOUNTER — Encounter: Payer: Federal, State, Local not specified - PPO | Admitting: Nurse Practitioner

## 2021-06-09 ENCOUNTER — Encounter: Payer: Self-pay | Admitting: *Deleted

## 2021-06-11 ENCOUNTER — Other Ambulatory Visit: Payer: Self-pay | Admitting: Nurse Practitioner

## 2021-06-13 ENCOUNTER — Other Ambulatory Visit: Payer: Self-pay

## 2021-06-13 MED ORDER — NA SULFATE-K SULFATE-MG SULF 17.5-3.13-1.6 GM/177ML PO SOLN: 1.0000 | ORAL | 0 refills | Status: DC

## 2021-06-17 ENCOUNTER — Ambulatory Visit: Payer: Federal, State, Local not specified - PPO

## 2021-06-25 ENCOUNTER — Other Ambulatory Visit: Payer: Self-pay

## 2021-06-25 ENCOUNTER — Ambulatory Visit
Admission: RE | Admit: 2021-06-25 | Discharge: 2021-06-25 | Disposition: A | Discharge status: Home / Self Care | Payer: Federal, State, Local not specified - PPO | Source: Ambulatory Visit | Attending: Nurse Practitioner | Admitting: Nurse Practitioner

## 2021-06-25 DIAGNOSIS — E049 Nontoxic goiter, unspecified: Secondary | ICD-10-CM | POA: Diagnosis present

## 2021-06-26 ENCOUNTER — Other Ambulatory Visit: Payer: Self-pay | Admitting: Nurse Practitioner

## 2021-06-26 ENCOUNTER — Telehealth: Payer: Self-pay

## 2021-06-26 DIAGNOSIS — E042 Nontoxic multinodular goiter: Secondary | ICD-10-CM

## 2021-06-26 DIAGNOSIS — E049 Nontoxic goiter, unspecified: Secondary | ICD-10-CM

## 2021-06-26 NOTE — Telephone Encounter (Signed)
Thanks will call her back

## 2021-06-26 NOTE — Telephone Encounter (Signed)
Pt to cb about [place] results and per result note pt to speak with Romilda Garret NP. Sending note to Romilda Garret NP and Anastasiya CMA.   [facility] Night - Client Nonclinical Telephone Record  [facility] [place] Night - Client Client Site [place] - Night Provider Romilda Garret- NP Contact Type Call Who Is Calling Patient / Member / Family / Caregiver Caller Name Diane Townsend Phone Number [phone] Call Type Message Only Information Provided Reason for Call Returning a Call from the Office Initial [name] is returning call for ultrasound results. Disp. Time Disposition Final User 06/26/2021 7:59:13 AM General Information Provided Yes [name], [name] Call Closed By: [name] Transaction Date/Time: 06/26/2021 7:56:15 AM (ET

## 2021-06-27 ENCOUNTER — Other Ambulatory Visit: Payer: Self-pay | Admitting: Nurse Practitioner

## 2021-06-27 ENCOUNTER — Encounter: Payer: Self-pay | Admitting: *Deleted

## 2021-06-27 DIAGNOSIS — E042 Nontoxic multinodular goiter: Secondary | ICD-10-CM

## 2021-06-27 NOTE — Addendum Note (Signed)
Addended by: Virl Cagey on: 06/27/2021 11:46 AM   Modules accepted: Orders

## 2021-07-01 ENCOUNTER — Other Ambulatory Visit (HOSPITAL_COMMUNITY)
Admission: RE | Admit: 2021-07-01 | Discharge: 2021-07-01 | Disposition: A | Discharge status: Home / Self Care | Payer: Federal, State, Local not specified - PPO | Source: Ambulatory Visit | Attending: Nurse Practitioner | Admitting: Nurse Practitioner

## 2021-07-01 ENCOUNTER — Ambulatory Visit
Admission: RE | Admit: 2021-07-01 | Discharge: 2021-07-01 | Disposition: A | Discharge status: Home / Self Care | Payer: Federal, State, Local not specified - PPO | Source: Ambulatory Visit | Attending: Nurse Practitioner | Admitting: Nurse Practitioner

## 2021-07-01 DIAGNOSIS — E042 Nontoxic multinodular goiter: Secondary | ICD-10-CM

## 2021-07-02 LAB — CYTOLOGY - NON PAP

## 2021-07-15 ENCOUNTER — Other Ambulatory Visit: Payer: Self-pay | Admitting: Primary Care

## 2021-07-15 NOTE — Telephone Encounter (Signed)
Beth, please advise on med refill. 

## 2021-07-16 NOTE — Telephone Encounter (Signed)
Yes that is fine, I only need to see her annually and I saw her in June

## 2021-07-17 ENCOUNTER — Encounter (HOSPITAL_COMMUNITY): Payer: Self-pay

## 2021-07-18 ENCOUNTER — Telehealth: Payer: Self-pay | Admitting: Nurse Practitioner

## 2021-07-18 NOTE — Telephone Encounter (Signed)
Called and discussed the Afrima test results with patient. Will continue to monitor with [place] of thyroid

## 2021-07-27 DIAGNOSIS — G4733 Obstructive sleep apnea (adult) (pediatric): Secondary | ICD-10-CM | POA: Diagnosis not present

## 2021-08-06 ENCOUNTER — Telehealth: Payer: Self-pay

## 2021-08-06 NOTE — Telephone Encounter (Signed)
Called Endo and talk to Almyra Free to get appointment cancel

## 2021-08-06 NOTE — Telephone Encounter (Signed)
Patient wants to cancel colonoscopy for 11/10/21 because her parents can not bring her that day. She states she will give [place] a call back when her parents give her a day they can bring her for colonoscopy.

## 2021-08-26 ENCOUNTER — Telehealth: Payer: Self-pay

## 2021-08-26 ENCOUNTER — Other Ambulatory Visit: Payer: Self-pay

## 2021-08-26 DIAGNOSIS — Z1211 Encounter for screening for malignant neoplasm of colon: Secondary | ICD-10-CM

## 2021-08-26 MED ORDER — PEG 3350-KCL-NA BICARB-NACL 420 G PO SOLR: 4000.0000 mL | Freq: Once | ORAL | 0 refills | Status: AC

## 2021-08-26 NOTE — Telephone Encounter (Signed)
Scheduled for 11/12/2021

## 2021-08-26 NOTE — Telephone Encounter (Signed)
Patient states she has a date she can do her colonoscopy now and it is  10/06/2021. Can you call and get patient scheduled

## 2021-08-27 DIAGNOSIS — G4733 Obstructive sleep apnea (adult) (pediatric): Secondary | ICD-10-CM | POA: Diagnosis not present

## 2021-09-07 ENCOUNTER — Other Ambulatory Visit: Payer: Self-pay | Admitting: Family

## 2021-09-07 DIAGNOSIS — K5900 Constipation, unspecified: Secondary | ICD-10-CM

## 2021-09-08 ENCOUNTER — Encounter: Payer: Self-pay | Admitting: Nurse Practitioner

## 2021-09-08 DIAGNOSIS — K5900 Constipation, unspecified: Secondary | ICD-10-CM

## 2021-09-08 MED ORDER — LINACLOTIDE 145 MCG PO CAPS: 145.0000 ug | ORAL_CAPSULE | Freq: Every day | ORAL | 5 refills | Status: DC

## 2021-09-19 ENCOUNTER — Other Ambulatory Visit: Payer: Self-pay

## 2021-09-19 ENCOUNTER — Ambulatory Visit
Admission: RE | Admit: 2021-09-19 | Discharge: 2021-09-19 | Disposition: A | Discharge status: Home / Self Care | Payer: Federal, State, Local not specified - PPO | Source: Ambulatory Visit | Attending: Nurse Practitioner | Admitting: Nurse Practitioner

## 2021-09-19 DIAGNOSIS — Z1231 Encounter for screening mammogram for malignant neoplasm of breast: Secondary | ICD-10-CM | POA: Diagnosis not present

## 2021-09-30 ENCOUNTER — Telehealth: Payer: Self-pay | Admitting: Gastroenterology

## 2021-09-30 NOTE — Telephone Encounter (Signed)
Called patient back and answered all of her questions.  ?

## 2021-09-30 NOTE — Telephone Encounter (Signed)
Patient has a few questions about procedure instructions, requesting call back. ?

## 2021-10-03 ENCOUNTER — Encounter: Payer: Self-pay | Admitting: Gastroenterology

## 2021-10-06 ENCOUNTER — Encounter
Admission: RE | Disposition: A | Discharge status: Home / Self Care | Payer: Self-pay | Source: Home / Self Care | Attending: Gastroenterology

## 2021-10-06 ENCOUNTER — Ambulatory Visit: Payer: Federal, State, Local not specified - PPO | Admitting: Anesthesiology

## 2021-10-06 ENCOUNTER — Encounter: Payer: Self-pay | Admitting: Gastroenterology

## 2021-10-06 ENCOUNTER — Ambulatory Visit
Admission: RE | Admit: 2021-10-06 | Discharge: 2021-10-06 | Disposition: A | Discharge status: Home / Self Care | Payer: Federal, State, Local not specified - PPO | Attending: Gastroenterology | Admitting: Gastroenterology

## 2021-10-06 DIAGNOSIS — K5790 Diverticulosis of intestine, part unspecified, without perforation or abscess without bleeding: Secondary | ICD-10-CM | POA: Diagnosis not present

## 2021-10-06 DIAGNOSIS — Z90710 Acquired absence of both cervix and uterus: Secondary | ICD-10-CM | POA: Diagnosis not present

## 2021-10-06 DIAGNOSIS — K635 Polyp of colon: Secondary | ICD-10-CM

## 2021-10-06 DIAGNOSIS — Z1211 Encounter for screening for malignant neoplasm of colon: Secondary | ICD-10-CM | POA: Insufficient documentation

## 2021-10-06 DIAGNOSIS — Z9049 Acquired absence of other specified parts of digestive tract: Secondary | ICD-10-CM | POA: Diagnosis not present

## 2021-10-06 DIAGNOSIS — D122 Benign neoplasm of ascending colon: Secondary | ICD-10-CM | POA: Insufficient documentation

## 2021-10-06 DIAGNOSIS — K589 Irritable bowel syndrome without diarrhea: Secondary | ICD-10-CM | POA: Diagnosis not present

## 2021-10-06 DIAGNOSIS — I10 Essential (primary) hypertension: Secondary | ICD-10-CM | POA: Insufficient documentation

## 2021-10-06 DIAGNOSIS — K640 First degree hemorrhoids: Secondary | ICD-10-CM | POA: Insufficient documentation

## 2021-10-06 DIAGNOSIS — G4733 Obstructive sleep apnea (adult) (pediatric): Secondary | ICD-10-CM | POA: Diagnosis not present

## 2021-10-06 DIAGNOSIS — K5730 Diverticulosis of large intestine without perforation or abscess without bleeding: Secondary | ICD-10-CM | POA: Insufficient documentation

## 2021-10-06 HISTORY — PX: COLONOSCOPY WITH PROPOFOL: SHX5780

## 2021-10-06 SURGERY — COLONOSCOPY WITH PROPOFOL
Anesthesia: General

## 2021-10-06 MED ORDER — SODIUM CHLORIDE 0.9 % IV SOLN
INTRAVENOUS | Status: DC
  Administered 2021-10-06: 1000 mL via INTRAVENOUS

## 2021-10-06 MED ORDER — LIDOCAINE HCL (CARDIAC) PF 100 MG/5ML IV SOSY
PREFILLED_SYRINGE | INTRAVENOUS | Status: DC | PRN
  Administered 2021-10-06: 40 mg via INTRAVENOUS

## 2021-10-06 MED ORDER — PROPOFOL 10 MG/ML IV BOLUS
INTRAVENOUS | Status: DC | PRN
  Administered 2021-10-06: 20 mg via INTRAVENOUS
  Administered 2021-10-06: 100 mg via INTRAVENOUS

## 2021-10-06 MED ORDER — PROPOFOL 500 MG/50ML IV EMUL
INTRAVENOUS | Status: AC
  Filled 2021-10-06: qty 50

## 2021-10-06 MED ORDER — PROPOFOL 500 MG/50ML IV EMUL
INTRAVENOUS | Status: DC | PRN
  Administered 2021-10-06: 150 ug/kg/min via INTRAVENOUS

## 2021-10-06 MED ORDER — DEXMEDETOMIDINE (PRECEDEX) IN NS 20 MCG/5ML (4 MCG/ML) IV SYRINGE
PREFILLED_SYRINGE | INTRAVENOUS | Status: DC | PRN
  Administered 2021-10-06: 12 ug via INTRAVENOUS

## 2021-10-06 NOTE — Transfer of Care (Signed)
Immediate Anesthesia Transfer of Care Note ? ?Patient: Diane Townsend ? ?Procedure(s) Performed: COLONOSCOPY WITH PROPOFOL ? ?Patient Location: PACU and Endoscopy Unit ? ?Anesthesia Type:General ? ?Level of Consciousness: drowsy ? ?Airway & Oxygen Therapy: Patient Spontanous Breathing and Patient connected to face mask ? ?Post-op Assessment: Report given to RN and Post -op Vital signs reviewed and stable ? ?Post vital signs: Reviewed and stable ? ?Last Vitals:  ?Vitals Value Taken Time  ?BP 119/67 10/06/21 0914  ?Temp    ?Pulse 73 10/06/21 0915  ?Resp 19 10/06/21 0915  ?SpO2 100 % 10/06/21 0915  ?Vitals shown include unvalidated device data. ? ?Last Pain:  ?Vitals:  ? 10/06/21 0749  ?TempSrc: Temporal  ?PainSc: 0-No pain  ?   ? ?  ? ?Complications: No notable events documented. ?

## 2021-10-06 NOTE — H&P (Signed)
? ? ? ?Jonathon Bellows, MD ?[address], [place], [place], [place], [address] ?[address], [place], [place], [place], [address] ?Phone: [phone]  ?Fax: [phone] ? ?Primary Care Physician:  Michela Pitcher, NP ? ? ?Pre-Procedure History & Physical: ?HPI:  Diane Townsend is a 46 y.o. female is here for an colonoscopy. ?  ?Past Medical History:  ?Diagnosis Date  ? Hypertensive disorder 05/09/2015  ? IBS (irritable bowel syndrome)   ? Obstructive sleep apnea syndrome 04/26/2014  ? ? ?Past Surgical History:  ?Procedure Laterality Date  ? ABDOMINAL HYSTERECTOMY    ? ABLATION ON ENDOMETRIOSIS    ? APPENDECTOMY  1992  ? CHOLECYSTECTOMY  1992  ? LAPAROSCOPIC HYSTERECTOMY  11/21/2015  ? Done for AUB, benign pathology. Ovaries still in place.  ? TUBAL LIGATION    ? WISDOM TOOTH EXTRACTION    ? ? ?Prior to Admission medications   ?Medication Sig Start Date End Date Taking? Authorizing Provider  ?amLODipine (NORVASC) 5 MG tablet TAKE 1 TABLET(5 MG) BY MOUTH DAILY 05/23/21  Yes Kennyth Arnold, FNP  ?Armodafinil 150 MG tablet TAKE 1 TABLET(150 MG) BY MOUTH DAILY 07/16/21  Yes Martyn Ehrich, NP  ?linaclotide [facility]) 145 MCG CAPS capsule Take 1 capsule (145 mcg total) by mouth daily before breakfast. 09/08/21  Yes Michela Pitcher, NP  ?Multiple Vitamins-Minerals (MULTIVITAMIN ADULT PO) Vitamin-Mineral Supplement oral liquid   Yes [provider]  ?Na Sulfate-K Sulfate-Mg Sulf (SUPREP BOWEL PREP KIT) 17.5-3.13-1.6 GM/177ML SOLN Take 1 kit by mouth as directed. 06/13/21  Yes Jonathon Bellows, MD  ?Omega-3 Fatty Acids (FISH OIL PO) Take by mouth.   Yes [provider]  ?SUNOSI 75 MG TABS Take 0.5 tablets by mouth daily. ?Patient not taking: Reported on 10/06/2021 03/18/20   [provider]  ? ? ?Allergies as of 08/26/2021  ? (No Known Allergies)  ? ? ?Family History  ?Problem Relation Age of Onset  ? Ovarian cancer Maternal Grandmother   ? Diabetes Paternal Grandmother   ? Lung cancer Paternal  Grandfather   ? Arthritis Paternal Grandfather   ? Hypertension Father   ? Hyperlipidemia Father   ? Breast cancer Neg Hx   ? ? ?Social History  ? ?Socioeconomic History  ? Marital status: Married  ?  Spouse name: Not on file  ? Number of children: Not on file  ? Years of education: Not on file  ? Highest education level: Not on file  ?Occupational History  ? Occupation: Librarian, academic  ?  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 Smoking status: Never  ? Smokeless tobacco: Never  ?Vaping Use  ? Vaping Use: Never used  ?Substance and Sexual Activity  ? Alcohol use: No  ? Drug use: No  ? Sexual activity: Yes  ?  Partners: Male  ?  Birth control/protection: Surgical  ?Other Topics Concern  ? Not on file  ?Social History Narrative  ? Not on file  ? ?Social Determinants of Health  ? ?Financial Resource Strain: Not on file  ?Food Insecurity: Not on file  ?Transportation Needs: Not on file  ?Physical Activity: Not on file  ?Stress: Not on file  ?Social Connections: Not on file  ?Intimate Partner Violence: Not on file  ? ? ?Review of Systems: ?See HPI, otherwise negative ROS ? ?Physical Exam: ?BP (!) 143/89   Pulse 77   Temp (!) 96.6 ?F (35.9 ?C) (Temporal)   Resp 18   Ht '5\' 5"'  (1.651  m)   Wt 94.1 kg   SpO2 98%   BMI 34.52 kg/m?  ?General:   Alert,  pleasant and cooperative in NAD ?Head:  Normocephalic and atraumatic. ?Neck:  Supple; no masses or thyromegaly. ?Lungs:  Clear throughout to auscultation, normal respiratory effort.    ?Heart:  +S1, +S2, Regular rate and rhythm, No edema. ?Abdomen:  Soft, nontender and nondistended. Normal bowel sounds, without guarding, and without rebound.   ?Neurologic:  Alert and  oriented x4;  grossly normal neurologically. ? ?Impression/Plan: ?Diane Townsend is here for an colonoscopy to be performed for Screening colonoscopy average risk   ?Risks, benefits, limitations, and alternatives regarding  colonoscopy have been reviewed with the patient.   Questions have been answered.  All parties agreeable. ? ? ?Jonathon Bellows, MD  10/06/2021, 8:42 AM ? ?

## 2021-10-06 NOTE — Op Note (Signed)
[facility] ?Gastroenterology ?Patient Name: Diane Townsend ?Procedure Date: 10/06/2021 8:41 AM ?MRN: [id number] ?Account #: [id number] ?Date of Birth: May 16, 1976 ?Admit Type: Outpatient ?Age: 46 ?Room: [facility] ENDO ROOM 4 ?Gender: Female ?Note Status: Finalized ?Instrument Name: Colonoscope [id number] ?Procedure:             Colonoscopy ?Indications:           Screening for colorectal malignant neoplasm ?Providers:             Jonathon Bellows MD, MD ?Medicines:             Monitored Anesthesia Care ?Complications:         No immediate complications. ?Procedure:             Pre-Anesthesia Assessment: ?                       - Prior to the procedure, a History and Physical was  ?                       performed, and patient medications, allergies and  ?                       sensitivities were reviewed. The patient's tolerance  ?                       of previous anesthesia was reviewed. ?                       - The risks and benefits of the procedure and the  ?                       sedation options and risks were discussed with the  ?                       patient. All questions were answered and informed  ?                       consent was obtained. ?                       - ASA Grade Assessment: II - A patient with mild  ?                       systemic disease. ?                       After obtaining informed consent, the colonoscope was  ?                       passed under direct vision. Throughout the procedure,  ?                       the patient's blood pressure, pulse, and oxygen  ?                       saturations were monitored continuously. The  ?                       Colonoscope was introduced through the anus and  ?  advanced to the the cecum, identified by the  ?                       appendiceal orifice. The colonoscopy was performed  ?                       with ease. The patient tolerated the procedure well.  ?                       The quality of the bowel  preparation was good. ?Findings: ?     The perianal and digital rectal examinations were normal. ?     Multiple small-mouthed diverticula were found in the sigmoid colon. ?     Non-bleeding internal hemorrhoids were found during retroflexion. The  ?     hemorrhoids were medium-sized and Grade I (internal hemorrhoids that do  ?     not prolapse). ?     A 5 mm polyp was found in the ascending colon. The polyp was sessile.  ?     The polyp was removed with a cold snare. Resection and retrieval were  ?     complete. ?     The exam was otherwise without abnormality on direct and retroflexion  ?     views. ?Impression:            - Diverticulosis in the sigmoid colon. ?                       - Non-bleeding internal hemorrhoids. ?                       - One 5 mm polyp in the ascending colon, removed with  ?                       a cold snare. Resected and retrieved. ?                       - The examination was otherwise normal on direct and  ?                       retroflexion views. ?Recommendation:        - Discharge patient to home (with escort). ?                       - Resume previous diet. ?                       - Continue present medications. ?                       - Await pathology results. ?                       - Repeat colonoscopy for surveillance based on  ?                       pathology results. ?Procedure Code(s):     --- Professional --- ?                       [phone], Colonoscopy, flexible; with removal of  ?  tumor(s), polyp(s), or other lesion(s) by snare  ?                       technique ?Diagnosis Code(s):     --- Professional --- ?                       Z12.11, Encounter for screening for malignant neoplasm  ?                       of colon ?                       K64.0, First degree hemorrhoids ?                       K63.5, Polyp of colon ?                       K57.30, Diverticulosis of large intestine without  ?                       perforation or abscess without  bleeding ?CPT copyright 2019 American Medical Association. All rights reserved. ?The codes documented in this report are preliminary and upon coder review may  ?be revised to meet current compliance requirements. ?Jonathon Bellows, MD ?Jonathon Bellows MD, MD ?10/06/2021 9:13:57 AM ?This report has been signed electronically. ?Number of Addenda: 0 ?Note Initiated On: 10/06/2021 8:41 AM ?Scope Withdrawal Time: 0 hours 12 minutes 52 seconds  ?Total Procedure Duration: 0 hours 18 minutes 5 seconds  ?Estimated Blood Loss:  Estimated blood loss: none. ?     [facility] ?

## 2021-10-06 NOTE — Anesthesia Procedure Notes (Signed)
Date/Time: 10/06/2021 8:49 AM ?Performed by: Doreen Salvage, CRNA ?Pre-anesthesia Checklist: Patient identified, Emergency Drugs available, Suction available and Patient being monitored ?Patient Re-evaluated:Patient Re-evaluated prior to induction ?Oxygen Delivery Method: Supernova nasal CPAP ?Induction Type: IV induction ?Dental Injury: Teeth and Oropharynx as per pre-operative assessment  ?Comments: Nasal cannula with etCO2 monitoring ? ? ? ? ?

## 2021-10-06 NOTE — Anesthesia Postprocedure Evaluation (Signed)
Anesthesia Post Note ? ?Patient: Diane Townsend ? ?Procedure(s) Performed: COLONOSCOPY WITH PROPOFOL ? ?Patient location during evaluation: Endoscopy ?Anesthesia Type: General ?Level of consciousness: awake and alert ?Pain management: pain level controlled ?Vital Signs Assessment: post-procedure vital signs reviewed and stable ?Respiratory status: spontaneous breathing, nonlabored ventilation, respiratory function stable and patient connected to nasal cannula oxygen ?Cardiovascular status: blood pressure returned to baseline and stable ?Postop Assessment: no apparent nausea or vomiting ?Anesthetic complications: no ? ? ?No notable events documented. ? ? ?Last Vitals:  ?Vitals:  ? 10/06/21 0934 10/06/21 0944  ?BP: 140/75 (!) 127/95  ?Pulse: 74 62  ?Resp: 16 11  ?Temp:    ?SpO2: 100% 100%  ?  ?Last Pain:  ?Vitals:  ? 10/06/21 0944  ?TempSrc:   ?PainSc: 0-No pain  ? ? ?  ?  ?  ?  ?  ?  ? ?Precious Haws Elisha Mcgruder ? ? ? ? ?

## 2021-10-06 NOTE — Anesthesia Preprocedure Evaluation (Signed)
Anesthesia Evaluation  ?Patient identified by MRN, date of birth, ID band ?Patient awake ? ? ? ?Reviewed: ?Allergy & Precautions, NPO status , Patient's Chart, lab work & pertinent test results ? ?History of Anesthesia Complications ?Negative for: history of anesthetic complications ? ?Airway ?Mallampati: III ? ?TM Distance: >3 FB ?Neck ROM: full ? ? ? Dental ? ?(+) Chipped ?  ?Pulmonary ?neg shortness of breath, sleep apnea and Continuous Positive Airway Pressure Ventilation ,  ?  ?Pulmonary exam normal ? ? ? ? ? ? ? Cardiovascular ?Exercise Tolerance: Good ?hypertension, (-) angina(-) Past MI Normal cardiovascular exam ? ? ?  ?Neuro/Psych ?negative neurological ROS ? negative psych ROS  ? GI/Hepatic ?negative GI ROS, Neg liver ROS,   ?Endo/Other  ?negative endocrine ROS ? Renal/GU ?negative Renal ROS  ?negative genitourinary ?  ?Musculoskeletal ? ? Abdominal ?  ?Peds ? Hematology ?negative hematology ROS ?(+)   ?Anesthesia Other Findings ?Past Medical History: ?05/09/2015: Hypertensive disorder ?No date: IBS (irritable bowel syndrome) ?04/26/2014: Obstructive sleep apnea syndrome ? ?Past Surgical History: ?No date: ABDOMINAL HYSTERECTOMY ?No date: ABLATION ON ENDOMETRIOSIS ?1992: APPENDECTOMY ?1992: CHOLECYSTECTOMY ?11/21/2015: LAPAROSCOPIC HYSTERECTOMY ?    Comment:  Done for AUB, benign pathology. Ovaries still in place. ?No date: TUBAL LIGATION ?No date: WISDOM TOOTH EXTRACTION ? ?BMI   ? Body Mass Index: 34.52 kg/m?  ?  ? ? Reproductive/Obstetrics ?negative OB ROS ? ?  ? ? ? ? ? ? ? ? ? ? ? ? ? ?  ?  ? ? ? ? ? ? ? ? ?Anesthesia Physical ?Anesthesia Plan ? ?ASA: 3 ? ?Anesthesia Plan: General  ? ?Post-op Pain Management:   ? ?Induction: Intravenous ? ?PONV Risk Score and Plan: Propofol infusion and TIVA ? ?Airway Management Planned: Natural Airway and Nasal Cannula ? ?Additional Equipment:  ? ?Intra-op Plan:  ? ?Post-operative Plan:  ? ?Informed Consent: I have reviewed the  patients History and Physical, chart, labs and discussed the procedure including the risks, benefits and alternatives for the proposed anesthesia with the patient or authorized representative who has indicated his/her understanding and acceptance.  ? ? ? ?Dental Advisory Given ? ?Plan Discussed with: Anesthesiologist, CRNA and Surgeon ? ?Anesthesia Plan Comments: (Patient consented for risks of anesthesia including but not limited to:  ?- adverse reactions to medications ?- risk of airway placement if required ?- damage to eyes, teeth, lips or other oral mucosa ?- nerve damage due to positioning  ?- sore throat or hoarseness ?- Damage to heart, brain, nerves, lungs, other parts of body or loss of life ? ?Patient voiced understanding.)  ? ? ? ? ? ? ?Anesthesia Quick Evaluation ? ?

## 2021-10-07 ENCOUNTER — Encounter: Payer: Self-pay | Admitting: Gastroenterology

## 2021-10-07 LAB — SURGICAL PATHOLOGY

## 2021-10-08 ENCOUNTER — Encounter: Payer: Self-pay | Admitting: Gastroenterology

## 2021-11-10 ENCOUNTER — Ambulatory Visit: Admit: 2021-11-10 | Payer: Federal, State, Local not specified - PPO | Admitting: Gastroenterology

## 2021-11-10 SURGERY — COLONOSCOPY
Anesthesia: General

## 2021-12-06 ENCOUNTER — Other Ambulatory Visit (HOSPITAL_COMMUNITY): Payer: Self-pay

## 2021-12-06 ENCOUNTER — Telehealth: Payer: Self-pay | Admitting: Pharmacy Technician

## 2021-12-06 NOTE — Telephone Encounter (Signed)
PA has been submitted.

## 2021-12-06 NOTE — Telephone Encounter (Signed)
Patient Advocate Encounter  Received notification from PATIENT that RENEWAL authorization for ARMODAFINIL '150MG'$  is required.   PA submitted on 5.20.23 Key [id number] Status is pending   [facility] will continue to follow  Luciano Cutter, CPhT Patient Advocate Phone: [phone]

## 2021-12-08 ENCOUNTER — Other Ambulatory Visit (HOSPITAL_COMMUNITY): Payer: Self-pay

## 2021-12-08 NOTE — Telephone Encounter (Signed)
Patient Advocate Encounter  Prior Authorization for Armodafinil '150MG'$  tablets has been approved.    PA# [id number] Effective dates: 11/06/2021 through 12/06/2022  Patients co-pay is $15.

## 2021-12-21 ENCOUNTER — Ambulatory Visit
Admission: EM | Admit: 2021-12-21 | Discharge: 2021-12-21 | Disposition: A | Discharge status: Home / Self Care | Payer: Federal, State, Local not specified - PPO | Attending: Emergency Medicine | Admitting: Emergency Medicine

## 2021-12-21 ENCOUNTER — Encounter: Payer: Self-pay | Admitting: Emergency Medicine

## 2021-12-21 DIAGNOSIS — L258 Unspecified contact dermatitis due to other agents: Secondary | ICD-10-CM | POA: Diagnosis not present

## 2021-12-21 DIAGNOSIS — I10 Essential (primary) hypertension: Secondary | ICD-10-CM

## 2021-12-21 MED ORDER — PREDNISONE 10 MG (21) PO TBPK: ORAL_TABLET | Freq: Every day | ORAL | 0 refills | Status: DC

## 2021-12-21 MED ORDER — METHYLPREDNISOLONE SODIUM SUCC 125 MG IJ SOLR
80.0000 mg | Freq: Once | INTRAMUSCULAR | Status: AC
  Administered 2021-12-21: 80 mg via INTRAMUSCULAR

## 2021-12-21 NOTE — ED Provider Notes (Signed)
Roderic Palau    CSN: [id number] Arrival date & time: 12/21/21  1042      History   Chief Complaint Chief Complaint  Patient presents with   Allergic Reaction    HPI Diane Townsend is a 46 y.o. female.  Patient presents with pruritic red rash on her arms, chest, and face today.  She used sunscreen on these areas yesterday and believes she is having an allergic reaction.  No difficulty swallowing or breathing.  Treatment at home with OTC antihistamine and topical Benadryl spray.  She denies fever, chills, shortness of breath, or other symptoms.  Her medical history includes hypertension, obesity, obstructive sleep apnea, irritable bowel syndrome.  The history is provided by the patient and medical records.   Past Medical History:  Diagnosis Date   Hypertensive disorder 05/09/2015   IBS (irritable bowel syndrome)    Obstructive sleep apnea syndrome 04/26/2014    Patient Active Problem List   Diagnosis Date Noted   Preventative health care 06/02/2021   Chronic idiopathic constipation 03/05/2021   Daytime sleepiness 12/27/2020   Caregiver stress 01/05/2020   Class 2 obesity due to excess calories without serious comorbidity with body mass index (BMI) of 36.0 to 36.9 in adult 05/29/2019   IBS (irritable bowel syndrome)    Essential hypertension 05/09/2015   Lack of energy 05/09/2015   Impacted cerumen 10/15/2014   Obstructive sleep apnea syndrome 04/26/2014    Past Surgical History:  Procedure Laterality Date   ABDOMINAL HYSTERECTOMY     ABLATION ON ENDOMETRIOSIS     APPENDECTOMY  1992   CHOLECYSTECTOMY  1992   COLONOSCOPY WITH PROPOFOL N/A 10/06/2021   Procedure: COLONOSCOPY WITH PROPOFOL;  Surgeon: Jonathon Bellows, MD;  Location: [facility] ENDOSCOPY;  Service: Gastroenterology;  Laterality: N/A;   LAPAROSCOPIC HYSTERECTOMY  11/21/2015   Done for AUB, benign pathology. Ovaries still in place.   TUBAL LIGATION     WISDOM TOOTH EXTRACTION      OB History     Gravida  2    Para  2   Term  2   Preterm  0   AB  0   Living  2      SAB  0   IAB  0   Ectopic  0   Multiple  0   Live Births  2            Home Medications    Prior to Admission medications   Medication Sig Start Date End Date Taking? Authorizing Provider  amLODipine (NORVASC) 5 MG tablet TAKE 1 TABLET(5 MG) BY MOUTH DAILY 05/23/21   Dutch Quint B, FNP  Armodafinil 150 MG tablet TAKE 1 TABLET(150 MG) BY MOUTH DAILY 07/16/21   Martyn Ehrich, NP  linaclotide [facility]) 145 MCG CAPS capsule Take 1 capsule (145 mcg total) by mouth daily before breakfast. 09/08/21   Michela Pitcher, NP  Multiple Vitamins-Minerals (MULTIVITAMIN ADULT PO) Vitamin-Mineral Supplement oral liquid    [provider]  Na Sulfate-K Sulfate-Mg Sulf (SUPREP BOWEL PREP KIT) 17.5-3.13-1.6 GM/177ML SOLN Take 1 kit by mouth as directed. 06/13/21   Jonathon Bellows, MD  Omega-3 Fatty Acids (FISH OIL PO) Take by mouth.    [provider]  SUNOSI 75 MG TABS Take 0.5 tablets by mouth daily. Patient not taking: Reported on 10/06/2021 03/18/20   [provider]    Family History Family History  Problem Relation Age of Onset   Ovarian cancer Maternal Grandmother    Diabetes Paternal  Grandmother    Lung cancer Paternal Grandfather    Arthritis Paternal Grandfather    Hypertension Father    Hyperlipidemia Father    Breast cancer Neg Hx     Social History Social History   Tobacco Use   Smoking status: Never   Smokeless tobacco: Never  Vaping Use   Vaping Use: Never used  Substance Use Topics   Alcohol use: No   Drug use: No     Allergies   Patient has no known allergies.   Review of Systems Review of Systems  Constitutional:  Negative for chills and fever.  HENT:  Negative for trouble swallowing.   Respiratory:  Negative for cough and shortness of breath.   Cardiovascular:  Negative for chest pain and palpitations.  Skin:  Positive for rash. Negative for color change.   All other systems reviewed and are negative.   Physical Exam Triage Vital Signs ED Triage Vitals  Enc Vitals Group     BP      Pulse      Resp      Temp      Temp src      SpO2      Weight      Height      Head Circumference      Peak Flow      Pain Score      Pain Loc      Pain Edu?      Excl. in [place]?    No data found.  Updated Vital Signs BP (!) 159/89 (BP Location: Left Arm)   Pulse 80   Temp 98.1 F (36.7 C) (Oral)   Resp 16   SpO2 98%   Visual Acuity Right Eye Distance:   Left Eye Distance:   Bilateral Distance:    Right Eye Near:   Left Eye Near:    Bilateral Near:     Physical Exam Vitals and nursing note reviewed.  Constitutional:      General: She is not in acute distress.    Appearance: She is well-developed. She is not ill-appearing.  HENT:     Mouth/Throat:     Mouth: Mucous membranes are moist.     Pharynx: Oropharynx is clear.     Comments: Speech clear.  No difficulty swallowing.  No oropharyngeal swelling. Cardiovascular:     Rate and Rhythm: Normal rate and regular rhythm.     Heart sounds: Normal heart sounds.  Pulmonary:     Effort: Pulmonary effort is normal. No respiratory distress.     Breath sounds: Normal breath sounds.  Musculoskeletal:     Cervical back: Neck supple.  Skin:    General: Skin is warm and dry.     Findings: Rash present.     Comments: Erythematous patchy rash on face, chest, arms.  Neurological:     Mental Status: She is alert.  Psychiatric:        Mood and Affect: Mood normal.        Behavior: Behavior normal.     UC Treatments / Results  Labs (all labs ordered are listed, but only abnormal results are displayed) Labs Reviewed - No data to display  EKG   Radiology No results found.  Procedures Procedures (including critical care time)  Medications Ordered in UC Medications  methylPREDNISolone sodium succinate (SOLU-MEDROL) 125 mg/2 mL injection 80 mg (has no administration in time range)     Initial Impression / Assessment and Plan / UC Course  I have reviewed the triage vital signs and the nursing notes.  Pertinent labs & imaging results that were available during my care of the patient were reviewed by me and considered in my medical decision making (see chart for details).  Contact dermatitis.  Elevated blood pressure reading with hypertension.  No airway compromise or respiratory distress.  Solu-Medrol given here and starting prednisone taper tomorrow.  Instructed patient to take Benadryl every 6 hours.  Precautions for drowsiness with Benadryl discussed.  Instructed patient to follow-up with her PCP if her symptoms are not improving.  Also discussed with patient that her blood pressure is elevated today and needs to be rechecked by PCP in 2 to 4 weeks.  Education provided on managing hypertension.  Patient agrees to plan of care.    Final Clinical Impressions(s) / UC Diagnoses   Final diagnoses:  Contact dermatitis due to other agent, unspecified contact dermatitis type  Elevated blood pressure reading in office with diagnosis of hypertension     Discharge Instructions      You were given an injection of a steroid called Solu-Medrol.  Start the prednisone taper tomorrow as directed.    Take Benadryl every 6 hours as directed; do not drive, operate machinery, or drink alcohol with this medication as it may cause drowsiness.    Follow up with your primary care provider if your symptoms are not improving.    Your blood pressure is elevated today at 159/89.  Please have this rechecked by your primary care provider in 2-4 weeks.          ED Prescriptions   None    PDMP not reviewed this encounter.   Sharion Balloon, NP 12/21/21 1126

## 2021-12-21 NOTE — ED Triage Notes (Signed)
Pt said yesterday she went to the pool yesterday and used [place] jack and today woke up with rash all over her arms and face. Very itchy and irritated.

## 2021-12-21 NOTE — Discharge Instructions (Addendum)
You were given an injection of a steroid called Solu-Medrol.  Start the prednisone taper tomorrow as directed.    Take Benadryl every 6 hours as directed; do not drive, operate machinery, or drink alcohol with this medication as it may cause drowsiness.    Follow up with your primary care provider if your symptoms are not improving.    Your blood pressure is elevated today at 159/89.  Please have this rechecked by your primary care provider in 2-4 weeks.

## 2021-12-29 ENCOUNTER — Ambulatory Visit (INDEPENDENT_AMBULATORY_CARE_PROVIDER_SITE_OTHER): Payer: Federal, State, Local not specified - PPO | Admitting: Primary Care

## 2021-12-29 ENCOUNTER — Encounter: Payer: Self-pay | Admitting: Primary Care

## 2021-12-29 VITALS — BP 126/76 | HR 89 | Ht 64.0 in | Wt 211.8 lb

## 2021-12-29 DIAGNOSIS — G4730 Sleep apnea, unspecified: Secondary | ICD-10-CM | POA: Diagnosis not present

## 2021-12-29 DIAGNOSIS — G4710 Hypersomnia, unspecified: Secondary | ICD-10-CM | POA: Insufficient documentation

## 2021-12-29 DIAGNOSIS — G4733 Obstructive sleep apnea (adult) (pediatric): Secondary | ICD-10-CM

## 2021-12-29 MED ORDER — ARMODAFINIL 150 MG PO TABS: ORAL_TABLET | ORAL | 3 refills | Status: DC

## 2021-12-29 NOTE — Assessment & Plan Note (Addendum)
-   Patient is 100% compliant with CPAP > 4 hours. Current CPAP pressure 13 cm H2O with residual AHI 1.7/hr. No changes today. Daytime sleepiness improved with addition of Armodafinil (Nuvigil). Epworth 2/24. Continue CPAP use nightly and Armodafinil '150mg'$  daily at 9am. Renew CPAP supplies with [employer]. Follow-up in 1 year or sooner.

## 2021-12-29 NOTE — Patient Instructions (Addendum)
Recommendations: Continue to wear CPAP every night for minimum 4 to 6 hours or longer Continue Armodafinil (Nuvigil) '150mg'$  daily as directed  Work on weight loss   Orders: Renew CPAP supplies with [employer]  Rx: Armodafinil (Nuvigil) '150mg'$  daily   Follow-up  1 year with [facility] NP or sooner if needed   Sleep Apnea Sleep apnea is a condition in which breathing pauses or becomes shallow during sleep. People with sleep apnea usually snore loudly. They may have times when they gasp and stop breathing for 10 seconds or more during sleep. This may happen many times during the night. Sleep apnea disrupts your sleep and keeps your body from getting the rest that it needs. This condition can increase your risk of certain health problems, including: Heart attack. Stroke. Obesity. Type 2 diabetes. Heart failure. Irregular heartbeat. High blood pressure. The goal of treatment is to help you breathe normally again. What are the causes?  The most common cause of sleep apnea is a collapsed or blocked airway. There are three kinds of sleep apnea: Obstructive sleep apnea. This kind is caused by a blocked or collapsed airway. Central sleep apnea. This kind happens when the part of the brain that controls breathing does not send the correct signals to the muscles that control breathing. Mixed sleep apnea. This is a combination of obstructive and central sleep apnea. What increases the risk? You are more likely to develop this condition if you: Are overweight. Smoke. Have a smaller than normal airway. Are older. Are female. Drink alcohol. Take sedatives or tranquilizers. Have a family history of sleep apnea. Have a tongue or tonsils that are larger than normal. What are the signs or symptoms? Symptoms of this condition include: Trouble staying asleep. Loud snoring. Morning headaches. Waking up gasping. Dry mouth or sore throat in the morning. Daytime sleepiness and tiredness. If you have  daytime fatigue because of sleep apnea, you may be more likely to have: Trouble concentrating. Forgetfulness. Irritability or mood swings. Personality changes. Feelings of depression. Sexual dysfunction. This may include loss of interest if you are female, or erectile dysfunction if you are female. How is this diagnosed? This condition may be diagnosed with: A medical history. A physical exam. A series of tests that are done while you are sleeping (sleep study). These tests are usually done in a sleep lab, but they may also be done at home. How is this treated? Treatment for this condition aims to restore normal breathing and to ease symptoms during sleep. It may involve managing health issues that can affect breathing, such as high blood pressure or obesity. Treatment may include: Sleeping on your side. Using a decongestant if you have nasal congestion. Avoiding the use of depressants, including alcohol, sedatives, and narcotics. Losing weight if you are overweight. Making changes to your diet. Quitting smoking. Using a device to open your airway while you sleep, such as: An oral appliance. This is a custom-made mouthpiece that shifts your lower jaw forward. A continuous positive airway pressure (CPAP) device. This device blows air through a mask when you breathe out (exhale). A nasal expiratory positive airway pressure (EPAP) device. This device has valves that you put into each nostril. A bi-level positive airway pressure (BIPAP) device. This device blows air through a mask when you breathe in (inhale) and breathe out (exhale). Having surgery if other treatments do not work. During surgery, excess tissue is removed to create a wider airway. Follow these instructions at home: Lifestyle Make any lifestyle changes  that your health care provider recommends. Eat a healthy, well-balanced diet. Take steps to lose weight if you are overweight. Avoid using depressants, including alcohol,  sedatives, and narcotics. Do not use any products that contain nicotine or tobacco. These products include cigarettes, chewing tobacco, and vaping devices, such as e-cigarettes. If you need help quitting, ask your health care provider. General instructions Take over-the-counter and prescription medicines only as told by your health care provider. If you were given a device to open your airway while you sleep, use it only as told by your health care provider. If you are having surgery, make sure to tell your health care provider you have sleep apnea. You may need to bring your device with you. Keep all follow-up visits. This is important. Contact a health care provider if: The device that you received to open your airway during sleep is uncomfortable or does not seem to be working. Your symptoms do not improve. Your symptoms get worse. Get help right away if: You develop: Chest pain. Shortness of breath. Discomfort in your back, arms, or stomach. You have: Trouble speaking. Weakness on one side of your body. Drooping in your face. These symptoms may represent a serious problem that is an emergency. Do not wait to see if the symptoms will go away. Get medical help right away. Call your local emergency services (911 in the [place] Do not drive yourself to the hospital. Summary Sleep apnea is a condition in which breathing pauses or becomes shallow during sleep. The most common cause is a collapsed or blocked airway. The goal of treatment is to restore normal breathing and to ease symptoms during sleep. This information is not intended to replace advice given to you by your health care provider. Make sure you discuss any questions you have with your health care provider. Document Revised: 02/12/2021 Document Reviewed: 06/14/2020 Elsevier Patient Education  [place].

## 2021-12-29 NOTE — Progress Notes (Signed)
Reviewed and agree with assessment/plan.   Chesley Mires, MD [facility] Pulmonary/Critical Care 12/29/2021, 12:41 PM Pager:  [phone]

## 2021-12-29 NOTE — Progress Notes (Signed)
'@Patient'  ID: Sudie Grumbling, female    DOB: [birth year], 46 y.o.   MRN: [id number]  Chief Complaint  Patient presents with   Follow-up    Referring provider: No ref. provider found  HPI: 46 year old female, never smoked.  Past medical history significant for OSA, pretension, obesity.  Patient of Dr. Halford Chessman, last seen by pulmonary nurse practitioner on 10/18/2019.  She is maintained on CPAP at 13 cm H2O.  Previous [facility] pulmonary encounter: 12/27/2020 Patient presents today for 1 year follow-up OSA/daytime sleepiness.  She is doing very well. No acute complaints. No issues with pressure setting or mask fit. She is 100% compliant with CPAP, using on average 8 hours and 42 minutes a night.  CPAP pressure 13 cm H2O, residual AHI 2.3. She takes Armodafinil 165m daily. Epworth score 3/40. She is going through some adjustments at home, her husband was recently placed in SIrmofacility d/t his past stroke.   Airview download 11/25/20-12/24/20: 30/30 days (100%); 30 days (100%) > 4 hours Average usage 8 hours 42 mins Pressure 13 cm h20 Airleaks 2.9L/min (95%) AHI 2.3   12/29/2021- Interim hx  Patient presents today for a 1 year follow-up OSA.  She is 100% compliant with CPAP use from 11/26/2021 - 12/25/2021.  Current pressure settings 13 cm H2O with residual AHI 1.7/hr. No issues sleeping at night. She gets 8 hours of sleep a night. Her husband past in February, she was her main care taker. She is maintained on Armodafinil (Nuvigil) 1527mdaily for residual daytime sleepiness in patient with OSA.  She is more alert during the daytime when taking medication. No residual daytime sleepiness. Epworth score 2/24.  Airview download 11/26/2021-12/25/2021 Usage 30/30 days; 100% greater than 4 hours Average usage 8 hours 37 minutes Pressure 13 cm H2O Air leaks 8.6 L/min (95%) AHI 1.7   No Known Allergies  Immunization History  Administered Date(s) Administered   Influenza Inj Mdck Quad With Preservative  05/29/2019   Influenza,inj,Quad PF,6+ Mos 05/23/2018, 05/29/2020, 06/02/2021   Influenza,inj,quad, With Preservative 05/28/2016   PFIZER(Purple Top)SARS-COV-2 Vaccination 10/27/2019, 11/16/2019   Pneumococcal Polysaccharide-23 07/20/2008   Tdap 05/23/2018    Past Medical History:  Diagnosis Date   Hypertensive disorder 05/09/2015   IBS (irritable bowel syndrome)    Obstructive sleep apnea syndrome 04/26/2014    Tobacco History: Social History   Tobacco Use  Smoking Status Never  Smokeless Tobacco Never   Counseling given: Not Answered   Outpatient Medications Prior to Visit  Medication Sig Dispense Refill   amLODipine (NORVASC) 5 MG tablet TAKE 1 TABLET(5 MG) BY MOUTH DAILY 270 tablet 0   linaclotide (LINZESS) 145 MCG CAPS capsule Take 1 capsule (145 mcg total) by mouth daily before breakfast. 30 capsule 5   Multiple Vitamins-Minerals (MULTIVITAMIN ADULT PO) Vitamin-Mineral Supplement oral liquid     Armodafinil 150 MG tablet TAKE 1 TABLET(150 MG) BY MOUTH DAILY 30 tablet 3   Na Sulfate-K Sulfate-Mg Sulf (SUPREP BOWEL PREP KIT) 17.5-3.13-1.6 GM/177ML SOLN Take 1 kit by mouth as directed. 354 mL 0   Omega-3 Fatty Acids (FISH OIL PO) Take by mouth.     predniSONE (STERAPRED UNI-PAK 21 TAB) 10 MG (21) TBPK tablet Take by mouth daily. As directed 21 tablet 0   SUNOSI 75 MG TABS Take 0.5 tablets by mouth daily. (Patient not taking: Reported on 10/06/2021)     No facility-administered medications prior to visit.   Review of Systems  Review of Systems  Constitutional: Negative.   HENT: Negative.  Respiratory: Negative.    Cardiovascular: Negative.    Physical Exam  BP 126/76 (BP Location: Left Arm, Cuff Size: Large)   Pulse 89   Ht '5\' 4"'  (1.626 m)   Wt 211 lb 12.8 oz (96.1 kg)   SpO2 98% Comment: on RA  BMI 36.36 kg/m  Physical Exam Constitutional:      General: She is not in acute distress.    Appearance: Normal appearance. She is obese. She is not ill-appearing.   HENT:     Head: Normocephalic and atraumatic.     Mouth/Throat:     Comments: Mallampati class III Cardiovascular:     Rate and Rhythm: Normal rate and regular rhythm.  Pulmonary:     Effort: Pulmonary effort is normal.     Breath sounds: Normal breath sounds.     Comments: CTA Musculoskeletal:        General: Normal range of motion.  Skin:    General: Skin is warm and dry.  Neurological:     General: No focal deficit present.     Mental Status: She is alert and oriented to person, place, and time. Mental status is at baseline.  Psychiatric:        Mood and Affect: Mood normal.        Behavior: Behavior normal.        Thought Content: Thought content normal.        Judgment: Judgment normal.      Lab Results:  CBC    Component Value Date/Time   WBC 4.3 06/02/2021 1042   RBC 4.77 06/02/2021 1042   HGB 13.0 06/02/2021 1042   HGB 13.8 12/01/2019 1459   HCT 38.6 06/02/2021 1042   HCT 41.8 12/01/2019 1459   PLT 300.0 06/02/2021 1042   PLT 290 12/01/2019 1459   MCV 80.9 06/02/2021 1042   MCV 81 12/01/2019 1459   MCH 26.8 12/01/2019 1459   MCHC 33.7 06/02/2021 1042   RDW 13.8 06/02/2021 1042   RDW 13.6 12/01/2019 1459    BMET    Component Value Date/Time   NA 142 06/02/2021 1042   NA 142 12/01/2019 1459   K 3.9 06/02/2021 1042   CL 106 06/02/2021 1042   CO2 30 06/02/2021 1042   GLUCOSE 106 (H) 06/02/2021 1042   BUN 14 06/02/2021 1042   BUN 12 12/01/2019 1459   CREATININE 0.81 06/02/2021 1042   CALCIUM 9.6 06/02/2021 1042   GFRNONAA 89 12/01/2019 1459   GFRAA 102 12/01/2019 1459    BNP No results found for: "BNP"  ProBNP No results found for: "PROBNP"  Imaging: No results found.   Assessment & Plan:   Sleep apnea with hypersomnolence - Patient is 100% compliant with CPAP > 4 hours. Current CPAP pressure 13 cm H2O with residual AHI 1.7/hr. No changes today. Daytime sleepiness improved with addition of Armodafinil (Nuvigil). Epworth 2/24. Continue  CPAP use nightly and Armodafinil 156m daily at 9am. Renew CPAP supplies with [employer]. Follow-up in 1 year or sooner.    EMartyn Ehrich NP 12/29/2021

## 2021-12-29 NOTE — Assessment & Plan Note (Deleted)
-   Patient is 100% compliant with CPAP > 4 hours.  Current CPAP pressure 13 cm H2O with residual AHI 1.7/hr. no changes today.  Renew CPAP supplies with [employer].  Follow-up in 1 year or sooner.

## 2022-02-24 ENCOUNTER — Telehealth: Payer: Self-pay

## 2022-02-24 NOTE — Telephone Encounter (Signed)
Prior auth started for Linzess 145 MCG capsules. Sudie Grumbling Key: [id number] - PA Case ID: [id number] Waiting for determination.

## 2022-02-25 NOTE — Telephone Encounter (Signed)
PA approved for dates 01/25/22-02/24/2023

## 2022-03-01 ENCOUNTER — Other Ambulatory Visit: Payer: Self-pay | Admitting: Nurse Practitioner

## 2022-03-01 DIAGNOSIS — K5900 Constipation, unspecified: Secondary | ICD-10-CM

## 2022-03-02 ENCOUNTER — Encounter: Payer: Self-pay | Admitting: Family Medicine

## 2022-03-02 ENCOUNTER — Telehealth: Payer: Federal, State, Local not specified - PPO | Admitting: Family Medicine

## 2022-03-02 VITALS — BP 159/99 | HR 93 | Temp 97.6°F | Wt 210.0 lb

## 2022-03-02 DIAGNOSIS — U071 COVID-19: Secondary | ICD-10-CM

## 2022-03-02 MED ORDER — NIRMATRELVIR/RITONAVIR (PAXLOVID)TABLET: 3.0000 | ORAL_TABLET | Freq: Two times a day (BID) | ORAL | 0 refills | Status: AC

## 2022-03-02 MED ORDER — CHERATUSSIN AC 100-10 MG/5ML PO SOLN: 5.0000 mL | Freq: Every evening | ORAL | 0 refills | Status: DC | PRN

## 2022-03-02 MED ORDER — BENZONATATE 200 MG PO CAPS: 200.0000 mg | ORAL_CAPSULE | Freq: Three times a day (TID) | ORAL | 1 refills | Status: DC | PRN

## 2022-03-02 NOTE — Progress Notes (Signed)
Naren Benally T. Caelin Rosen, MD, [place] [facility] [place], [address]  Phone: [phone]  FAX: [phone]  Diane Townsend - 46 y.o. female  MRN [id number]  Date of Birth: [birth year]  Date: 03/02/2022  PCP: Michela Pitcher, NP  Referral: Michela Pitcher, NP  Chief Complaint  Patient presents with   Covid Positive    Test positive yesterday Sunday, cough, body aches,    Virtual Visit via Video Note:  I connected with  Sudie Grumbling on 03/02/2022  2:00 PM EDT by a video enabled telemedicine application and verified that I am speaking with the correct person using two identifiers.   Location patient: home computer, tablet, or smartphone Location provider: work or home office Consent: Verbal consent directly obtained from [employer]. Persons participating in the virtual visit: patient, provider  I discussed the limitations of evaluation and management by telemedicine and the availability of in person appointments. The patient expressed understanding and agreed to proceed.  Chief Complaint  Patient presents with   Covid Positive    Test positive yesterday Sunday, cough, body aches,     History of Present Illness:  Started on Friday, did not have any energy, body aches.  Back, forehead.  Symptoms then progressed over the weekend and starting yesterday she started to have some diffuse body aches and polyarthralgias as well as significant cough.  She coughed throughout much of the night last night and she slept only every couple of hours.  She is eating and drinking okay without any nausea, vomiting, diarrhea.  She does not have any known COVID exposures, but she does do normal day-to-day business such as going to the grocery store.  No fever.  Immunization History  Administered Date(s) Administered   Influenza Inj Mdck Quad With Preservative 05/29/2019   Influenza,inj,Quad PF,6+ Mos 05/23/2018, 05/29/2020,  06/02/2021   Influenza,inj,quad, With Preservative 05/28/2016   PFIZER(Purple Top)SARS-COV-2 Vaccination 10/27/2019, 11/16/2019   Pneumococcal Polysaccharide-23 07/20/2008   Tdap 05/23/2018     Review of Systems as above: See pertinent positives and pertinent negatives per HPI No acute distress verbally   Observations/Objective/Exam:  An attempt was made to discern vital signs over the phone and per patient if applicable and possible.   General:    Alert, Oriented, appears well and in no acute distress  Pulmonary:     On inspection no signs of respiratory distress.  Psych / Neurological:     Pleasant and cooperative.  Assessment and Plan:    ICD-10-CM   1. COVID-19  U07.1      Antivirals and additional supportive care.  Push fluid and isolate for 5 total days then mask for 5 additional days.  I discussed the assessment and treatment plan with the patient. The patient was provided an opportunity to ask questions and all were answered. The patient agreed with the plan and demonstrated an understanding of the instructions.   The patient was advised to call back or seek an in-person evaluation if the symptoms worsen or if the condition fails to improve as anticipated.  Follow-up: prn unless noted otherwise below No follow-ups on file.  Meds ordered this encounter  Medications   nirmatrelvir/ritonavir EUA (PAXLOVID) 20 x 150 MG & 10 x '100MG'$  TABS    Sig: Take 3 tablets by mouth 2 (two) times daily for 5 days. (Take nirmatrelvir 150 mg two tablets twice daily for 5 days and ritonavir 100 mg one tablet twice daily for  5 days) Patient GFR is 87.    Dispense:  30 tablet    Refill:  0   benzonatate (TESSALON) 200 MG capsule    Sig: Take 1 capsule (200 mg total) by mouth 3 (three) times daily as needed for cough.    Dispense:  40 capsule    Refill:  1   guaiFENesin-codeine (CHERATUSSIN AC) 100-10 MG/5ML syrup    Sig: Take 5 mLs by mouth at bedtime as needed for cough.     Dispense:  120 mL    Refill:  0   No orders of the defined types were placed in this encounter.   Signed,  Maud Deed. Arriah Wadle, MD

## 2022-03-04 ENCOUNTER — Encounter: Payer: Self-pay | Admitting: Family Medicine

## 2022-03-04 ENCOUNTER — Telehealth: Payer: Self-pay | Admitting: Nurse Practitioner

## 2022-03-04 NOTE — Telephone Encounter (Signed)
Patient called and stated that if she can get a doctor note sent to her my chart for this week she had an appointment on 03/02/2022 with Copland for a my chart visit. Call back number [phone].

## 2022-03-04 NOTE — Telephone Encounter (Signed)
Letter send to Mychart

## 2022-03-04 NOTE — Telephone Encounter (Signed)
Yes, she had covid.  Please do, and you will want to coordinate dates with her for her note.

## 2022-03-13 DIAGNOSIS — G4733 Obstructive sleep apnea (adult) (pediatric): Secondary | ICD-10-CM | POA: Diagnosis not present

## 2022-04-04 ENCOUNTER — Other Ambulatory Visit: Payer: Self-pay

## 2022-04-04 ENCOUNTER — Emergency Department (HOSPITAL_BASED_OUTPATIENT_CLINIC_OR_DEPARTMENT_OTHER)
Admission: EM | Admit: 2022-04-04 | Discharge: 2022-04-05 | Disposition: A | Discharge status: Home / Self Care | Payer: Federal, State, Local not specified - PPO | Attending: Emergency Medicine | Admitting: Emergency Medicine

## 2022-04-04 ENCOUNTER — Emergency Department (HOSPITAL_BASED_OUTPATIENT_CLINIC_OR_DEPARTMENT_OTHER): Payer: Federal, State, Local not specified - PPO

## 2022-04-04 DIAGNOSIS — D72829 Elevated white blood cell count, unspecified: Secondary | ICD-10-CM | POA: Insufficient documentation

## 2022-04-04 DIAGNOSIS — I10 Essential (primary) hypertension: Secondary | ICD-10-CM | POA: Diagnosis not present

## 2022-04-04 DIAGNOSIS — K5732 Diverticulitis of large intestine without perforation or abscess without bleeding: Secondary | ICD-10-CM | POA: Diagnosis not present

## 2022-04-04 DIAGNOSIS — Z79899 Other long term (current) drug therapy: Secondary | ICD-10-CM | POA: Insufficient documentation

## 2022-04-04 DIAGNOSIS — R1033 Periumbilical pain: Secondary | ICD-10-CM | POA: Diagnosis not present

## 2022-04-04 DIAGNOSIS — K5720 Diverticulitis of large intestine with perforation and abscess without bleeding: Secondary | ICD-10-CM | POA: Insufficient documentation

## 2022-04-04 LAB — CBC
HCT: 40.8 % (ref 36.0–46.0)
Hemoglobin: 14 g/dL (ref 12.0–15.0)
MCH: 27 pg (ref 26.0–34.0)
MCHC: 34.3 g/dL (ref 30.0–36.0)
MCV: 78.8 fL — ABNORMAL LOW (ref 80.0–100.0)
Platelets: 246 10*3/uL (ref 150–400)
RBC: 5.18 MIL/uL — ABNORMAL HIGH (ref 3.87–5.11)
RDW: 13.5 % (ref 11.5–15.5)
WBC: 12.1 10*3/uL — ABNORMAL HIGH (ref 4.0–10.5)
nRBC: 0 % (ref 0.0–0.2)

## 2022-04-04 LAB — COMPREHENSIVE METABOLIC PANEL
ALT: 28 U/L (ref 0–44)
AST: 22 U/L (ref 15–41)
Albumin: 4.8 g/dL (ref 3.5–5.0)
Alkaline Phosphatase: 47 U/L (ref 38–126)
Anion gap: 12 (ref 5–15)
BUN: 10 mg/dL (ref 6–20)
CO2: 26 mmol/L (ref 22–32)
Calcium: 9.9 mg/dL (ref 8.9–10.3)
Chloride: 98 mmol/L (ref 98–111)
Creatinine, Ser: 0.77 mg/dL (ref 0.44–1.00)
GFR, Estimated: >60 mL/min (ref >60)
Glucose, Bld: 124 mg/dL — ABNORMAL HIGH (ref 70–99)
Potassium: 3.6 mmol/L (ref 3.5–5.1)
Sodium: 136 mmol/L (ref 135–145)
Total Bilirubin: 0.6 mg/dL (ref 0.3–1.2)
Total Protein: 8.3 g/dL — ABNORMAL HIGH (ref 6.5–8.1)

## 2022-04-04 LAB — URINALYSIS, ROUTINE W REFLEX MICROSCOPIC
Bilirubin Urine: NEGATIVE
Glucose, UA: NEGATIVE mg/dL
Hgb urine dipstick: NEGATIVE
Leukocytes,Ua: NEGATIVE
Nitrite: NEGATIVE
Specific Gravity, Urine: 1.028 (ref 1.005–1.030)
pH: 6.5 (ref 5.0–8.0)

## 2022-04-04 LAB — PREGNANCY, URINE: Preg Test, Ur: NEGATIVE

## 2022-04-04 LAB — LIPASE, BLOOD: Lipase: <10 U/L — ABNORMAL LOW (ref 11–51)

## 2022-04-04 MED ORDER — MORPHINE SULFATE (PF) 4 MG/ML IV SOLN
4.0000 mg | Freq: Once | INTRAVENOUS | Status: AC
  Administered 2022-04-04: 4 mg via INTRAVENOUS
  Filled 2022-04-04: qty 1

## 2022-04-04 MED ORDER — SODIUM CHLORIDE 0.9 % IV BOLUS (SEPSIS)
1000.0000 mL | Freq: Once | INTRAVENOUS | Status: AC
Start: 2022-04-04 — End: 2022-04-05
  Administered 2022-04-04: 1000 mL via INTRAVENOUS

## 2022-04-04 MED ORDER — PROCHLORPERAZINE EDISYLATE 10 MG/2ML IJ SOLN
10.0000 mg | Freq: Once | INTRAMUSCULAR | Status: AC
Start: 2022-04-04 — End: 2022-04-04
  Administered 2022-04-04: 10 mg via INTRAVENOUS
  Filled 2022-04-04: qty 2

## 2022-04-04 MED ORDER — IOHEXOL 300 MG/ML  SOLN
100.0000 mL | Freq: Once | INTRAMUSCULAR | Status: AC | PRN
  Administered 2022-04-04: 100 mL via INTRAVENOUS

## 2022-04-04 MED ORDER — SODIUM CHLORIDE 0.9 % IV SOLN: 1000.0000 mL | INTRAVENOUS | Status: DC

## 2022-04-04 NOTE — ED Triage Notes (Signed)
POV, pt sts that she had small BM yesterday but feels very constipated since last night, endorses vomiting, bloating and cramps. Pt has IBS-C and tried enema at home without success. Pt is alert and oriented x 4, amb to triage.

## 2022-04-04 NOTE — ED Provider Notes (Signed)
[facility] EMERGENCY DEPT Provider Note  CSN: [id number] Arrival date & time: 04/04/22 2021  Chief Complaint(s) Emesis  HPI Deara Bober is a 46 y.o. female {Add pertinent medical, surgical, social history, OB history to HPI:1}   The history is provided by the patient.  Abdominal Pain Pain location:  Periumbilical and suprapubic Pain quality: cramping and sharp   Pain radiates to:  L flank Pain severity:  Moderate Onset quality:  Gradual Duration:  1 day Timing:  Constant Progression:  Waxing and waning Chronicity:  New Context: suspicious food intake   Relieved by:  Nothing Worsened by:  Movement and palpation Associated symptoms: constipation (chronic), nausea and vomiting     Past Medical History Past Medical History:  Diagnosis Date   Hypertensive disorder 05/09/2015   IBS (irritable bowel syndrome)    Obstructive sleep apnea syndrome 04/26/2014   Patient Active Problem List   Diagnosis Date Noted   Sleep apnea with hypersomnolence 12/29/2021   Preventative health care 06/02/2021   Chronic idiopathic constipation 03/05/2021   Daytime sleepiness 12/27/2020   Caregiver stress 01/05/2020   Class 2 obesity due to excess calories without serious comorbidity with body mass index (BMI) of 36.0 to 36.9 in adult 05/29/2019   IBS (irritable bowel syndrome)    Essential hypertension 05/09/2015   Lack of energy 05/09/2015   Impacted cerumen 10/15/2014   Obstructive sleep apnea syndrome 04/26/2014   Home Medication(s) Prior to Admission medications   Medication Sig Start Date End Date Taking? Authorizing Provider  amLODipine (NORVASC) 5 MG tablet TAKE 1 TABLET(5 MG) BY MOUTH DAILY 05/23/21   Dutch Quint B, FNP  Armodafinil 150 MG tablet Take 1 tablet daily at 9am 12/29/21   Martyn Ehrich, NP  benzonatate (TESSALON) 200 MG capsule Take 1 capsule (200 mg total) by mouth 3 (three) times daily as needed for cough. 03/02/22   Copland, Frederico Hamman, MD   guaiFENesin-codeine (CHERATUSSIN AC) 100-10 MG/5ML syrup Take 5 mLs by mouth at bedtime as needed for cough. 03/02/22   Copland, Frederico Hamman, MD  LINZESS 145 MCG CAPS capsule TAKE 1 CAPSULE(145 MCG) BY MOUTH DAILY BEFORE BREAKFAST 03/02/22   Michela Pitcher, NP  Multiple Vitamins-Minerals (MULTIVITAMIN ADULT PO) Vitamin-Mineral Supplement oral liquid    [provider]  Phenylephrine-DM-GG-APAP (MUCINEX FAST-MAX SEVERE COLD PO) Take by mouth. Take every 4 to 6 hours    [provider]                                                                                                                                    Allergies Patient has no known allergies.  Review of Systems Review of Systems  Gastrointestinal:  Positive for abdominal pain, constipation (chronic), nausea and vomiting.   As noted in HPI  Physical Exam Vital Signs  I have reviewed the triage vital signs BP (!) 151/87   Pulse 91   Temp 98.3 F (36.8 C)  Resp 18   Ht '5\' 4"'$  (1.626 m)   Wt 95.3 kg   SpO2 98%   BMI 36.05 kg/m  *** Physical Exam Vitals reviewed.  Constitutional:      General: She is not in acute distress.    Appearance: She is well-developed. She is not diaphoretic.  HENT:     Head: Normocephalic and atraumatic.     Right Ear: External ear normal.     Left Ear: External ear normal.     Nose: Nose normal.  Eyes:     General: No scleral icterus.    Conjunctiva/sclera: Conjunctivae normal.  Neck:     Trachea: Phonation normal.  Cardiovascular:     Rate and Rhythm: Normal rate and regular rhythm.  Pulmonary:     Effort: Pulmonary effort is normal. No respiratory distress.     Breath sounds: No stridor.  Abdominal:     General: There is distension.     Tenderness: There is generalized abdominal tenderness. There is no guarding or rebound.  Musculoskeletal:        General: Normal range of motion.     Cervical back: Normal range of motion.  Neurological:     Mental Status: She is  alert and oriented to person, place, and time.  Psychiatric:        Behavior: Behavior normal.     ED Results and Treatments Labs (all labs ordered are listed, but only abnormal results are displayed) Labs Reviewed  LIPASE, BLOOD - Abnormal; Notable for the following components:      Result Value   Lipase <10 (*)    All other components within normal limits  COMPREHENSIVE METABOLIC PANEL - Abnormal; Notable for the following components:   Glucose, Bld 124 (*)    Total Protein 8.3 (*)    All other components within normal limits  CBC - Abnormal; Notable for the following components:   WBC 12.1 (*)    RBC 5.18 (*)    MCV 78.8 (*)    All other components within normal limits  URINALYSIS, ROUTINE W REFLEX MICROSCOPIC - Abnormal; Notable for the following components:   Ketones, ur TRACE (*)    Protein, ur TRACE (*)    All other components within normal limits  PREGNANCY, URINE                                                                                                                         EKG  EKG Interpretation  Date/Time:    Ventricular Rate:    PR Interval:    QRS Duration:   QT Interval:    QTC Calculation:   R Axis:     Text Interpretation:         Radiology No results found.  Medications Ordered in ED Medications - No data to display  Procedures Procedures  (including critical care time)  Medical Decision Making / ED Course   Medical Decision Making Amount and/or Complexity of Data Reviewed Labs: ordered.          Final Clinical Impression(s) / ED Diagnoses Final diagnoses:  None    {Document critical care time when appropriate:1}  {Document review of labs and clinical decision tools ie heart score, Chads2Vasc2 etc:1}  {Document your independent review of radiology images, and any outside records:1} {Document  your discussion with family members, caretakers, and with consultants:1} {Document social determinants of health affecting pt's care:1} {Document your decision making why or why not admission, treatments were needed:1} This chart was dictated using voice recognition software.  Despite best efforts to proofread,  errors can occur which can change the documentation meaning.

## 2022-04-05 MED ORDER — ONDANSETRON 4 MG PO TBDP: 4.0000 mg | ORAL_TABLET | Freq: Three times a day (TID) | ORAL | 0 refills | Status: AC | PRN

## 2022-04-05 MED ORDER — AMOXICILLIN-POT CLAVULANATE 875-125 MG PO TABS
1.0000 | ORAL_TABLET | Freq: Once | ORAL | Status: AC
  Administered 2022-04-05: 1 via ORAL
  Filled 2022-04-05: qty 1

## 2022-04-05 MED ORDER — AMOXICILLIN-POT CLAVULANATE 875-125 MG PO TABS: 1.0000 | ORAL_TABLET | Freq: Two times a day (BID) | ORAL | 0 refills | Status: DC

## 2022-04-05 MED ORDER — OXYCODONE HCL 5 MG PO TABS: 2.5000 mg | ORAL_TABLET | Freq: Four times a day (QID) | ORAL | 0 refills | Status: AC | PRN

## 2022-04-05 NOTE — Discharge Instructions (Addendum)
For pain control you may take at 1000 mg of Tylenol every 8 hours scheduled.  In addition you can take 0.5 to 1 tablet of Oxycodone every 6 hours as needed for pain not controlled with the scheduled Tylenol. ? ?

## 2022-04-13 DIAGNOSIS — G4733 Obstructive sleep apnea (adult) (pediatric): Secondary | ICD-10-CM | POA: Diagnosis not present

## 2022-06-03 ENCOUNTER — Ambulatory Visit (INDEPENDENT_AMBULATORY_CARE_PROVIDER_SITE_OTHER): Payer: Federal, State, Local not specified - PPO | Admitting: Nurse Practitioner

## 2022-06-03 ENCOUNTER — Encounter: Payer: Self-pay | Admitting: Nurse Practitioner

## 2022-06-03 ENCOUNTER — Encounter: Payer: Self-pay | Admitting: *Deleted

## 2022-06-03 VITALS — BP 130/82 | HR 88 | Temp 98.3°F | Resp 12 | Ht 64.0 in | Wt 208.0 lb

## 2022-06-03 DIAGNOSIS — G4733 Obstructive sleep apnea (adult) (pediatric): Secondary | ICD-10-CM | POA: Diagnosis not present

## 2022-06-03 DIAGNOSIS — I10 Essential (primary) hypertension: Secondary | ICD-10-CM

## 2022-06-03 DIAGNOSIS — E041 Nontoxic single thyroid nodule: Secondary | ICD-10-CM

## 2022-06-03 DIAGNOSIS — E6609 Other obesity due to excess calories: Secondary | ICD-10-CM | POA: Diagnosis not present

## 2022-06-03 DIAGNOSIS — Z23 Encounter for immunization: Secondary | ICD-10-CM

## 2022-06-03 DIAGNOSIS — Z6836 Body mass index (BMI) 36.0-36.9, adult: Secondary | ICD-10-CM | POA: Diagnosis not present

## 2022-06-03 DIAGNOSIS — K5904 Chronic idiopathic constipation: Secondary | ICD-10-CM

## 2022-06-03 DIAGNOSIS — Z0000 Encounter for general adult medical examination without abnormal findings: Secondary | ICD-10-CM | POA: Diagnosis not present

## 2022-06-03 DIAGNOSIS — J069 Acute upper respiratory infection, unspecified: Secondary | ICD-10-CM | POA: Diagnosis not present

## 2022-06-03 MED ORDER — AZITHROMYCIN 250 MG PO TABS: ORAL_TABLET | ORAL | 0 refills | Status: AC

## 2022-06-03 MED ORDER — BENZONATATE 100 MG PO CAPS: 100.0000 mg | ORAL_CAPSULE | Freq: Three times a day (TID) | ORAL | 0 refills | Status: AC | PRN

## 2022-06-03 NOTE — Patient Instructions (Signed)
Nice to see you today I want to see you in 1 year for your next physical.  Call and get the ultrasound of the thyroid set up  I will be in touch with the lab results once I have them

## 2022-06-03 NOTE — Assessment & Plan Note (Signed)
Patient is doing some exercise.  Encouraged healthy lifestyle modifications regards increasing exercise from 3 days a week to 5 days a week 30 minutes each time.

## 2022-06-03 NOTE — Assessment & Plan Note (Signed)
Given length of illness will treat with azithromycin pack 250 mg.  Darnelle Maffucci Perles as needed follow-up if no improvement

## 2022-06-03 NOTE — Assessment & Plan Note (Signed)
Patient currently maintained on amlodipine 5 mg.  Blood pressure within normal limits today.  Continue to take blood pressure medicine as prescribed

## 2022-06-03 NOTE — Assessment & Plan Note (Signed)
Patient currently maintained on CPAP doing well.  States she is followed by pulmonology can using CPAP as prescribed

## 2022-06-03 NOTE — Progress Notes (Signed)
Established Patient Office Visit  Subjective   Patient ID: Diane Townsend, female    DOB: [birth year]  Age: 46 y.o. MRN: [id number]  Chief Complaint  Patient presents with   Annual Exam    Had partial hysterectomy, ovaries intact   Cough    X 4 weeks. No other symptoms just cough. Has tried Mucinex, Delsym, cold and cough, lemon, honey, tea.     Cough Pertinent negatives include no chest pain, chills, ear pain, fever, headaches, sore throat or shortness of breath.    HTN: Does check her bp at home. Once a week readings are good  OSA: CPAP that she uses nightly and 6-8 hours of restorative sleep.  Constiation: Taking linzess. States that she does well with medication have a bowel movement daily.  Cough: been going on for approx 4 weeks. Statesa that she is not having any other sympotms. States that she has been taking mucinex and has not helped. Statesa that she does have a tessalon peral and it helps. Has covid tested at home.  Has tried delsym, lemon, honey, Has tried over the counter antihistamines. States that it does help some for complete physical and follow up of chronic conditions.  Immunizations: -Tetanus:2019 -Influenza: up date today  -Shingles: Too young -Pneumonia: Too young  -HPV: Aged out  Diet: Fair diet. States that she is doing 2 meals a day. Not a big snacker. Statea that she will drink water and juice (2 cups) Exercise: No regular exercise. States that she will walk. 3 times a week 30 mins.  Eye exam: PRN Dental exam: Completes semi-annually   Pap Smear: Partial hysterectomy ovaries intact Mammogram: Completed in March 2023  Colonoscopy: Completed in 2023, recall 7 years 2030 Lung Cancer Screening: N/A Dexa: N/A  Sleep:  She will go to bed around 11-12 and get up around 6-7. Feels rested. Does not snore.   US thyroid      Review of Systems  Constitutional:  Negative for chills and fever.  HENT:  Negative for congestion, ear discharge, ear  pain and sore throat.   Respiratory:  Positive for cough and sputum production (sparing). Negative for shortness of breath.   Cardiovascular:  Negative for chest pain.  Gastrointestinal:  Positive for constipation. Negative for abdominal pain, diarrhea, nausea and vomiting.       BM daily with linzess  Genitourinary:  Negative for dysuria and hematuria.  Neurological:  Negative for headaches.  Psychiatric/Behavioral:  Negative for hallucinations and suicidal ideas.       Objective:     BP 130/82   Pulse 88   Temp 98.3 F (36.8 C) (Oral)   Resp 12   Ht '5\' 4"'$  (1.626 m)   Wt 208 lb (94.3 kg)   SpO2 97%   BMI 35.70 kg/m  BP Readings from Last 3 Encounters:  06/03/22 130/82  04/05/22 131/87  03/02/22 (!) 159/99   Wt Readings from Last 3 Encounters:  06/03/22 208 lb (94.3 kg)  04/04/22 210 lb (95.3 kg)  03/02/22 210 lb (95.3 kg)      Physical Exam Vitals and nursing note reviewed.  Constitutional:      Appearance: Normal appearance. She is obese.  HENT:     Right Ear: Tympanic membrane, ear canal and external ear normal.     Left Ear: Tympanic membrane, ear canal and external ear normal.  Neck:     Thyroid: Thyromegaly present.  Cardiovascular:     Rate and Rhythm: Normal rate and regular  rhythm.     Pulses: Normal pulses.     Heart sounds: Normal heart sounds.  Pulmonary:     Effort: Pulmonary effort is normal.     Breath sounds: Normal breath sounds.  Abdominal:     General: Bowel sounds are normal. There is no distension.     Palpations: There is no mass.     Tenderness: There is no abdominal tenderness.     Hernia: No hernia is present.  Musculoskeletal:     Right lower leg: No edema.     Left lower leg: No edema.  Lymphadenopathy:     Cervical: No cervical adenopathy.  Skin:    General: Skin is warm.  Neurological:     Mental Status: She is alert.  Psychiatric:        Mood and Affect: Mood normal.        Behavior: Behavior normal.        Thought  Content: Thought content normal.        Judgment: Judgment normal.      No results found for any visits on 06/03/22.    The 10-year ASCVD risk score (Arnett DK, et al., 2019) is: 3.2%    Assessment & Plan:   Problem List Items Addressed This Visit       Cardiovascular and Mediastinum   Essential hypertension    Patient currently maintained on amlodipine 5 mg.  Blood pressure within normal limits today.  Continue to take blood pressure medicine as prescribed      Relevant Orders   CBC   Comprehensive metabolic panel   TSH     Respiratory   Obstructive sleep apnea syndrome    Patient currently maintained on CPAP doing well.  States she is followed by pulmonology can using CPAP as prescribed      Upper respiratory tract infection - Primary    Given length of illness will treat with azithromycin pack 250 mg.  Darnelle Maffucci Perles as needed follow-up if no improvement      Relevant Medications   azithromycin (ZITHROMAX) 250 MG tablet     Digestive   Chronic idiopathic constipation    Patient currently maintained on Linzess 145 mcg.  Tolerates medication well is able to have a bowel movement daily with this medication continue as prescribed        Endocrine   Thyroid nodule    Was evaluated by ultrasound last December and did a fine-needle biopsy which was negative.  Recommended follow-up ultrasound in 1 year.  Ultrasound placed today patient given information to call and schedule at her convenience.      Relevant Orders   US THYROID     Other   Class 2 obesity due to excess calories without serious comorbidity with body mass index (BMI) of 36.0 to 36.9 in adult    Patient is doing some exercise.  Encouraged healthy lifestyle modifications regards increasing exercise from 3 days a week to 5 days a week 30 minutes each time.      Relevant Orders   Lipid panel   Hemoglobin A1c   Preventative health care    Test age-appropriate immunizations and screening exams.   Patient was given information at discharge in regards to preventative healthcare maintenance for her age range and anticipatory guidance.  Flu vaccine updated today.  Colonoscopy up-to-date.  Mammogram up-to-date.  Patient longer participates in Pap smears.      Relevant Orders   CBC   Lipid panel   Comprehensive  metabolic panel   TSH   Hemoglobin A1c   Other Visit Diagnoses     Need for immunization against influenza       Relevant Orders   Flu Vaccine QUAD 6+ mos PF IM (Fluarix Quad PF) (Completed)       Return in about 1 year (around 06/04/2023) for CPE and Labs.    Romilda Garret, NP

## 2022-06-03 NOTE — Assessment & Plan Note (Signed)
Patient currently maintained on Linzess 145 mcg.  Tolerates medication well is able to have a bowel movement daily with this medication continue as prescribed

## 2022-06-03 NOTE — Assessment & Plan Note (Signed)
Was evaluated by ultrasound last December and did a fine-needle biopsy which was negative.  Recommended follow-up ultrasound in 1 year.  Ultrasound placed today patient given information to call and schedule at her convenience.

## 2022-06-03 NOTE — Assessment & Plan Note (Signed)
Test age-appropriate immunizations and screening exams.  Patient was given information at discharge in regards to preventative healthcare maintenance for her age range and anticipatory guidance.  Flu vaccine updated today.  Colonoscopy up-to-date.  Mammogram up-to-date.  Patient longer participates in Pap smears.

## 2022-06-04 ENCOUNTER — Telehealth: Payer: Self-pay | Admitting: Nurse Practitioner

## 2022-06-04 DIAGNOSIS — R7989 Other specified abnormal findings of blood chemistry: Secondary | ICD-10-CM

## 2022-06-04 LAB — HEMOGLOBIN A1C: Hgb A1c MFr Bld: 5.7 % (ref 4.6–6.5)

## 2022-06-04 LAB — LIPID PANEL
Cholesterol: 157 mg/dL (ref 0–200)
HDL: 44.5 mg/dL (ref >39.00)
NonHDL: 112.22
Total CHOL/HDL Ratio: 4
Triglycerides: 245 mg/dL — ABNORMAL HIGH (ref 0.0–149.0)
VLDL: 49 mg/dL — ABNORMAL HIGH (ref 0.0–40.0)

## 2022-06-04 LAB — CBC
HCT: 41.1 % (ref 36.0–46.0)
Hemoglobin: 14 g/dL (ref 12.0–15.0)
MCHC: 34 g/dL (ref 30.0–36.0)
MCV: 80.9 fl (ref 78.0–100.0)
Platelets: 263 10*3/uL (ref 150.0–400.0)
RBC: 5.09 Mil/uL (ref 3.87–5.11)
RDW: 13.4 % (ref 11.5–15.5)
WBC: 6.6 10*3/uL (ref 4.0–10.5)

## 2022-06-04 LAB — COMPREHENSIVE METABOLIC PANEL
ALT: 122 U/L — ABNORMAL HIGH (ref 0–35)
AST: 57 U/L — ABNORMAL HIGH (ref 0–37)
Albumin: 4.5 g/dL (ref 3.5–5.2)
Alkaline Phosphatase: 57 U/L (ref 39–117)
BUN: 14 mg/dL (ref 6–23)
CO2: 31 mEq/L (ref 19–32)
Calcium: 10 mg/dL (ref 8.4–10.5)
Chloride: 102 mEq/L (ref 96–112)
Creatinine, Ser: 0.83 mg/dL (ref 0.40–1.20)
GFR: 83.98 mL/min (ref >60.00)
Glucose, Bld: 90 mg/dL (ref 70–99)
Potassium: 4.1 mEq/L (ref 3.5–5.1)
Sodium: 140 mEq/L (ref 135–145)
Total Bilirubin: 0.8 mg/dL (ref 0.2–1.2)
Total Protein: 7.7 g/dL (ref 6.0–8.3)

## 2022-06-04 LAB — TSH: TSH: 1.52 u[IU]/mL (ref 0.35–5.50)

## 2022-06-04 LAB — LDL CHOLESTEROL, DIRECT: Direct LDL: 74 mg/dL

## 2022-06-04 NOTE — Telephone Encounter (Signed)
-----   Message from [place] sent at 06/04/2022  2:15 PM EST ----- Patient advised. Patient states no for the alcohol been regular or a lot but yes for Tylenol with the recent cold. She is going to cut back on that and come back on 07/04/22 to re check

## 2022-06-15 ENCOUNTER — Ambulatory Visit: Admission: RE | Admit: 2022-06-15 | Payer: Federal, State, Local not specified - PPO | Source: Ambulatory Visit

## 2022-06-17 ENCOUNTER — Other Ambulatory Visit: Payer: Self-pay | Admitting: Family

## 2022-06-23 NOTE — Addendum Note (Signed)
Addended by: Emelia Salisbury C on: 06/23/2022 03:16 PM   Modules accepted: Orders

## 2022-06-23 NOTE — Telephone Encounter (Signed)
Patient called in to follow up on this medication refill. She stated that she received a message that it the medication wasn't approved. Thank you!

## 2022-06-23 NOTE — Telephone Encounter (Addendum)
Last refill was sent in By Dutch Quint 05/2021 #270 Had misread the last refill but it was sent in 2022 instead of 2023 Last office visit 06/03/22

## 2022-06-24 MED ORDER — AMLODIPINE BESYLATE 5 MG PO TABS: ORAL_TABLET | ORAL | 2 refills | Status: DC

## 2022-07-06 ENCOUNTER — Other Ambulatory Visit (INDEPENDENT_AMBULATORY_CARE_PROVIDER_SITE_OTHER): Payer: Federal, State, Local not specified - PPO

## 2022-07-06 DIAGNOSIS — R7989 Other specified abnormal findings of blood chemistry: Secondary | ICD-10-CM

## 2022-07-06 LAB — HEPATIC FUNCTION PANEL
ALT: 33 U/L (ref 0–35)
AST: 22 U/L (ref 0–37)
Albumin: 4.3 g/dL (ref 3.5–5.2)
Alkaline Phosphatase: 40 U/L (ref 39–117)
Bilirubin, Direct: 0.1 mg/dL (ref 0.0–0.3)
Total Bilirubin: 0.4 mg/dL (ref 0.2–1.2)
Total Protein: 7.1 g/dL (ref 6.0–8.3)

## 2022-07-12 ENCOUNTER — Emergency Department (HOSPITAL_BASED_OUTPATIENT_CLINIC_OR_DEPARTMENT_OTHER)
Admission: EM | Admit: 2022-07-12 | Discharge: 2022-07-12 | Disposition: A | Discharge status: Home / Self Care | Payer: Federal, State, Local not specified - PPO | Attending: Emergency Medicine | Admitting: Emergency Medicine

## 2022-07-12 ENCOUNTER — Other Ambulatory Visit: Payer: Self-pay

## 2022-07-12 ENCOUNTER — Emergency Department (HOSPITAL_BASED_OUTPATIENT_CLINIC_OR_DEPARTMENT_OTHER): Payer: Federal, State, Local not specified - PPO

## 2022-07-12 ENCOUNTER — Encounter (HOSPITAL_BASED_OUTPATIENT_CLINIC_OR_DEPARTMENT_OTHER): Payer: Self-pay

## 2022-07-12 DIAGNOSIS — R051 Acute cough: Secondary | ICD-10-CM

## 2022-07-12 DIAGNOSIS — Z1152 Encounter for screening for COVID-19: Secondary | ICD-10-CM | POA: Diagnosis not present

## 2022-07-12 DIAGNOSIS — I10 Essential (primary) hypertension: Secondary | ICD-10-CM | POA: Insufficient documentation

## 2022-07-12 DIAGNOSIS — M791 Myalgia, unspecified site: Secondary | ICD-10-CM | POA: Insufficient documentation

## 2022-07-12 DIAGNOSIS — Z79899 Other long term (current) drug therapy: Secondary | ICD-10-CM | POA: Insufficient documentation

## 2022-07-12 DIAGNOSIS — R059 Cough, unspecified: Secondary | ICD-10-CM | POA: Insufficient documentation

## 2022-07-12 DIAGNOSIS — R110 Nausea: Secondary | ICD-10-CM | POA: Diagnosis not present

## 2022-07-12 DIAGNOSIS — J101 Influenza due to other identified influenza virus with other respiratory manifestations: Secondary | ICD-10-CM

## 2022-07-12 LAB — RESP PANEL BY RT-PCR (RSV, FLU A&B, COVID)  RVPGX2
Influenza A by PCR: POSITIVE — AB
Influenza B by PCR: NEGATIVE
Resp Syncytial Virus by PCR: NEGATIVE
SARS Coronavirus 2 by RT PCR: NEGATIVE

## 2022-07-12 LAB — GROUP A STREP BY PCR: Group A Strep by PCR: NOT DETECTED

## 2022-07-12 MED ORDER — BENZONATATE 100 MG PO CAPS: 100.0000 mg | ORAL_CAPSULE | Freq: Three times a day (TID) | ORAL | 0 refills | Status: DC

## 2022-07-12 NOTE — Discharge Instructions (Signed)
You have been seen today for your complaint of cough and body aches. Your lab work was positive for Influenza A. Your imaging showed irregularities on the right side of your windpipe.  Radiology recommends follow-up with a CT.  There is no emergent indication for CT at this time.  You should follow-up with your primary care provider regarding this in 1 week. Your discharge medications include Tessalon Perles.  This is a cough medication.  You may also take Delsym, Mucinex, cough drops, warm liquids with honey for cough. Home care instructions are as follows:  Drink plenty of water over the next 7 days Follow up with: Primary care provider in 1 week Please seek immediate medical care if you develop any of the following symptoms: Develop shortness of breath or have difficulty breathing. Have skin or nails that turn a bluish color. Have severe pain or stiffness in your neck. Develop a sudden headache or sudden pain in your face or ear. Cannot eat or drink without vomiting. At this time there does not appear to be the presence of an emergent medical condition, however there is always the potential for conditions to change. Please read and follow the below instructions.  Do not take your medicine if  develop an itchy rash, swelling in your mouth or lips, or difficulty breathing; call 911 and seek immediate emergency medical attention if this occurs.  You may review your lab tests and imaging results in their entirety on your MyChart account.  Please discuss all results of fully with your primary care provider and other specialist at your follow-up visit.  Note: Portions of this text may have been transcribed using voice recognition software. Every effort was made to ensure accuracy; however, inadvertent computerized transcription errors may still be present.

## 2022-07-12 NOTE — ED Provider Notes (Signed)
[facility] DEPT Provider Note   CSN: [id number] Arrival date & time: 07/12/22  9924     History  Chief Complaint  Patient presents with   Cough    Diane Townsend is a 46 y.o. female.  With a history of right-sided thyroid nodules, hypertension, sleep apnea, IBS who presents to the ED for evaluation of a cough and bodyaches.  Symptoms been present for 3 days.  She did travel recently.  Cough has been productive of clear sputum.  There is no associated shortness of breath.  She does have mild nausea but has been taking Zofran at home with improvement in her symptoms.  States she has been taking [employer] with minimal improvement.  Cough is most common after she drinks liquids.  She has not had any fevers, chest pain, shortness of breath, vomiting, diarrhea.  Had a similar episode in November which lasted approximately 1 week.   Cough Associated symptoms: myalgias        Home Medications Prior to Admission medications   Medication Sig Start Date End Date Taking? Authorizing Provider  amLODipine (NORVASC) 5 MG tablet Take 1 tablet by mouth daily 06/24/22   Michela Pitcher, NP  Armodafinil 150 MG tablet Take 1 tablet daily at 9am 12/29/21   Martyn Ehrich, NP  LINZESS 145 MCG CAPS capsule TAKE 1 CAPSULE(145 MCG) BY MOUTH DAILY BEFORE BREAKFAST 03/02/22   Michela Pitcher, NP  Multiple Vitamins-Minerals (MULTIVITAMIN ADULT PO) Vitamin-Mineral Supplement oral liquid    [provider]  Phenylephrine-DM-GG-APAP (MUCINEX FAST-MAX SEVERE COLD PO) Take by mouth. Take every 4 to 6 hours    [provider]      Allergies    Patient has no known allergies.    Review of Systems   Review of Systems  Respiratory:  Positive for cough.   Musculoskeletal:  Positive for myalgias.  All other systems reviewed and are negative.   Physical Exam Updated Vital Signs BP (!) 155/89 (BP Location: Right Arm)   Pulse 88   Temp 98.8 F (37.1 C) (Oral)    Resp 18   SpO2 96%  Physical Exam Vitals and nursing note reviewed.  Constitutional:      General: She is not in acute distress.    Appearance: Normal appearance. She is well-developed. She is obese. She is not ill-appearing, toxic-appearing or diaphoretic.  HENT:     Head: Normocephalic and atraumatic.     Nose: No congestion or rhinorrhea.     Mouth/Throat:     Mouth: Mucous membranes are moist.     Pharynx: Oropharynx is clear. No oropharyngeal exudate or posterior oropharyngeal erythema.  Eyes:     Conjunctiva/sclera: Conjunctivae normal.  Cardiovascular:     Rate and Rhythm: Normal rate and regular rhythm.     Pulses: Normal pulses.     Heart sounds: No murmur heard. Pulmonary:     Effort: Pulmonary effort is normal. No respiratory distress.     Breath sounds: Normal breath sounds. No stridor. No wheezing, rhonchi or rales.  Abdominal:     Palpations: Abdomen is soft.     Tenderness: There is no abdominal tenderness. There is no guarding.  Musculoskeletal:        General: No swelling.     Cervical back: Normal range of motion and neck supple. No rigidity.     Left lower leg: No edema.  Skin:    General: Skin is warm and dry.     Capillary Refill:  Capillary refill takes less than 2 seconds.  Neurological:     General: No focal deficit present.     Mental Status: She is alert and oriented to person, place, and time.  Psychiatric:        Mood and Affect: Mood normal.     ED Results / Procedures / Treatments   Labs (all labs ordered are listed, but only abnormal results are displayed) Labs Reviewed  RESP PANEL BY RT-PCR (RSV, FLU A&B, COVID)  RVPGX2 - Abnormal; Notable for the following components:      Result Value   Influenza A by PCR POSITIVE (*)    All other components within normal limits  GROUP A STREP BY PCR  RESP PANEL BY RT-PCR (RSV, FLU A&B, COVID)  RVPGX2    EKG None  Radiology DG Chest Port 1 View  Result Date: 07/12/2022 CLINICAL DATA:  Cough.  EXAM: PORTABLE CHEST 1 VIEW COMPARISON:  None Available. FINDINGS: i The lungs are clear without focal pneumonia, edema, pneumothorax or pleural effusion. Right paratracheal opacity evident. Patient has a history of large right thyroid nodules including a 4.4 cm lesion in the inferior right thyroid lobe. The cardiopericardial silhouette is within normal limits for size. The visualized bony structures of the thorax are unremarkable. IMPRESSION: 1. No acute cardiopulmonary findings. 2. Right paratracheal opacity, likely related to the patient's known right thyroid nodules. Comparison to earlier chest x-rays recommended to ensure chronicity. In the absence of prior imaging, consider follow-up CT chest to ensure that the opacity is related to the thyroid gland. Electronically Signed   By: Misty Stanley M.D.   On: 07/12/2022 12:11    Procedures Procedures    Medications Ordered in ED Medications - No data to display  ED Course/ Medical Decision Making/ A&P                           Medical Decision Making Amount and/or Complexity of Data Reviewed Radiology: ordered.  This patient presents to the ED for concern of cough, body aches, this involves an extensive number of treatment options, and is a complaint that carries with it a high risk of complications and morbidity.  The differential diagnosis includes flu, COVID, RSV, other URI, pneumonia   Co morbidities that complicate the patient evaluation  right-sided thyroid nodules, hypertension, sleep apnea, IBS  My initial workup includes respiratory panel, chest x-ray  Additional history obtained from: Nursing notes from this visit.  I ordered, reviewed and interpreted labs which include: Respiratory panel, strep flu a positive.   I ordered imaging studies including chest x-ray I independently visualized and interpreted imaging which showed no acute cardiopulmonary abnormalities.  There are peritracheal obesities on the right side which  appear to be consistent with right-sided thyroid nodules.  Recommend follow-up CT I agree with the radiologist interpretation  Afebrile, hemodynamically stable.  46 year old female presenting to the ED for evaluation of cough and bodyaches.  She did test positive for influenza A here.  Cough is productive of clear sputum.  She has not had any fevers.  She does not have shortness of breath.  There are no adventitious breath sounds on exam.  The rest of her exam is completely unremarkable and patient looks overall very well.  Chest x-ray showed no acute cardiopulmonary abnormalities.  I have low suspicion of superimposed pneumonia as a cause of her cough.  She does have right-sided paratracheal opacities which we recommended she follows up with  her primary care provider for.  No emergent indication for CT chest as she does not have any shortness of breath or infectious symptoms.  She was strongly encouraged to follow-up with her primary care provider for chest CT.  Was given a prescription for [employer] and educated on symptomatic management of influenza.  She is also in the window for treatment for Tamiflu.  Was given return precautions.  Stable at discharge.  At this time there does not appear to be any evidence of an acute emergency medical condition and the patient appears stable for discharge with appropriate outpatient follow up. Diagnosis was discussed with patient who verbalizes understanding of care plan and is agreeable to discharge. I have discussed return precautions with patient who verbalizes understanding. Patient encouraged to follow-up with their PCP within 1 week. All questions answered.  Patient's case discussed with Dr. Melina Copa who agrees with plan to discharge with follow-up.   Note: Portions of this report may have been transcribed using voice recognition software. Every effort was made to ensure accuracy; however, inadvertent computerized transcription errors may still be present.          Final Clinical Impression(s) / ED Diagnoses Final diagnoses:  None    Rx / DC Orders ED Discharge Orders     None         Nehemiah Massed 07/12/22 1238    Hayden Rasmussen, MD 07/12/22 1850

## 2022-07-12 NOTE — ED Notes (Signed)
Discharge paperwork given and verbally understood. 

## 2022-07-12 NOTE — ED Triage Notes (Signed)
Pt reports cough since Weds, along with sore throat and abdominal muscle pain with coughing.  Pt denies any pain at rest or with eating - reports pain only with coughing fits.

## 2022-07-15 DIAGNOSIS — G4733 Obstructive sleep apnea (adult) (pediatric): Secondary | ICD-10-CM | POA: Diagnosis not present

## 2022-07-21 ENCOUNTER — Other Ambulatory Visit: Payer: Self-pay | Admitting: Primary Care

## 2022-07-21 NOTE — Telephone Encounter (Signed)
Please advise on refill request

## 2022-07-22 NOTE — Telephone Encounter (Signed)
ok 

## 2022-08-15 DIAGNOSIS — G4733 Obstructive sleep apnea (adult) (pediatric): Secondary | ICD-10-CM | POA: Diagnosis not present

## 2022-08-15 IMAGING — US US FNA BIOPSY THYROID 1ST LESION
1 series · 13 of 19 positions shown · non-contrast
Comparison: Prior thyroid ultrasound 06/25/2021

MEDICATIONS:
None

COMPLICATIONS:
None immediate.

INDICATION: Indeterminate thyroid nodules

EXAM:
ULTRASOUND GUIDED FINE NEEDLE ASPIRATION OF INDETERMINATE THYROID
NODULE
TECHNIQUE: Informed written consent was obtained from the patient after a
discussion of the risks, benefits and alternatives to treatment.
Questions regarding the procedure were encouraged and answered. A
timeout was performed prior to the initiation of the procedure.

[Series 1: us fna biopsy thyroid 1st lesion · 0.09mm/px · 19 acquisitions, 13 frames shown]
[im 1/19]
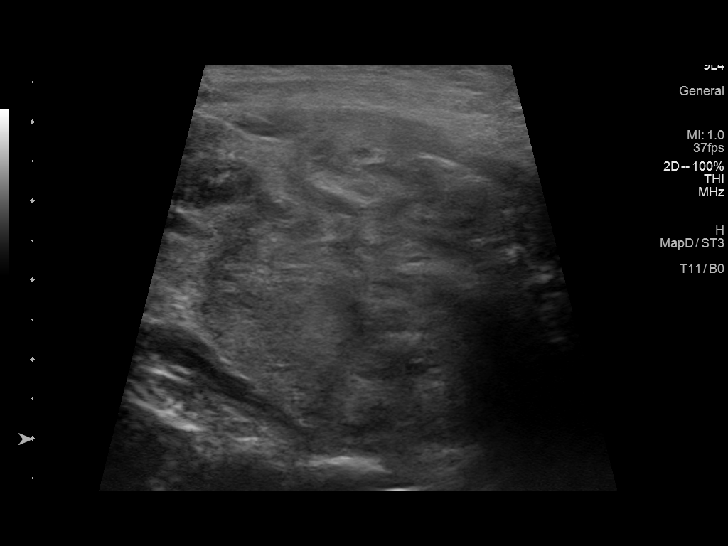
[im 3/19]
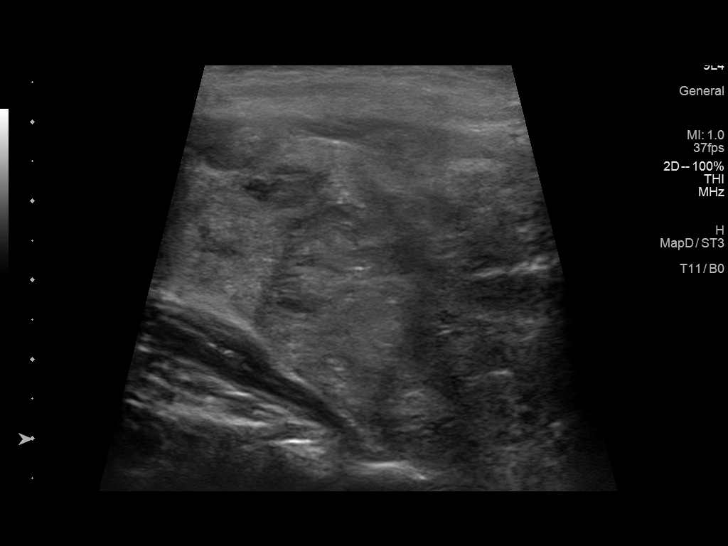
[im 4/19]
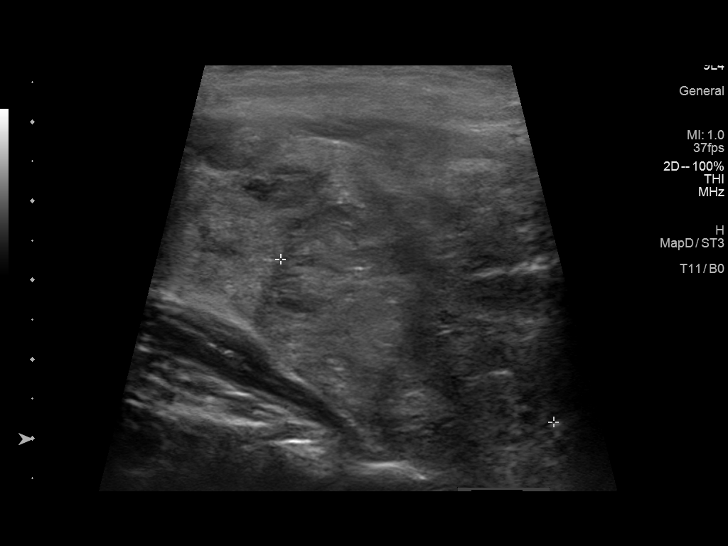
[im 6/19]
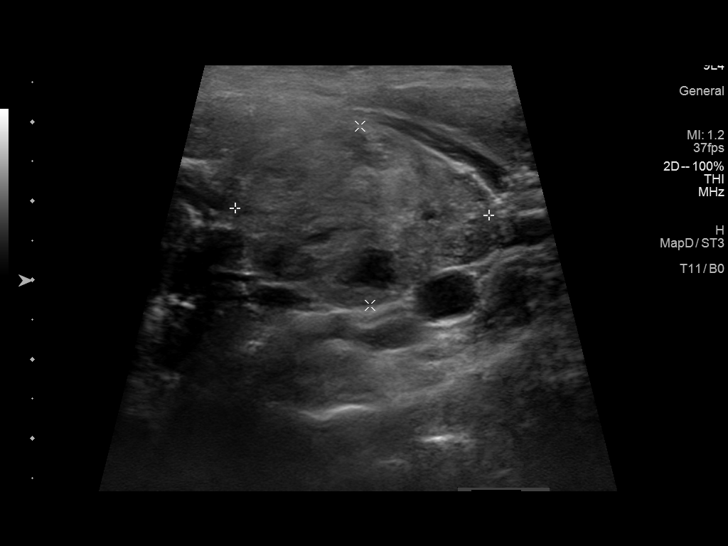
[im 7/19]
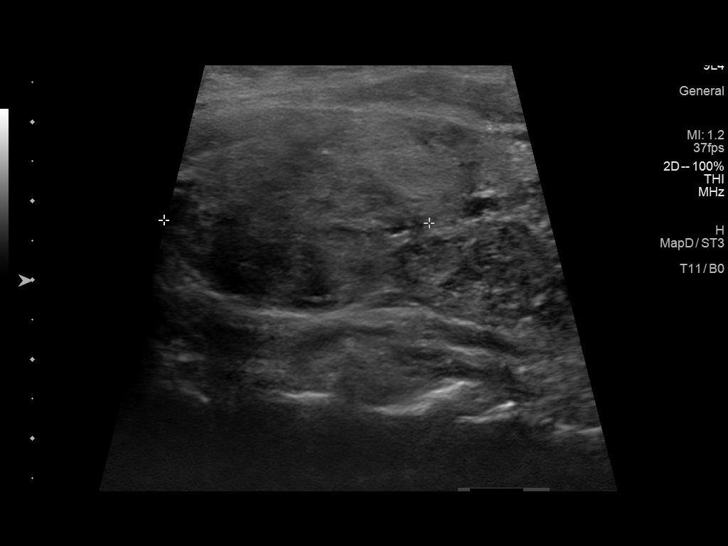
[im 9/19]
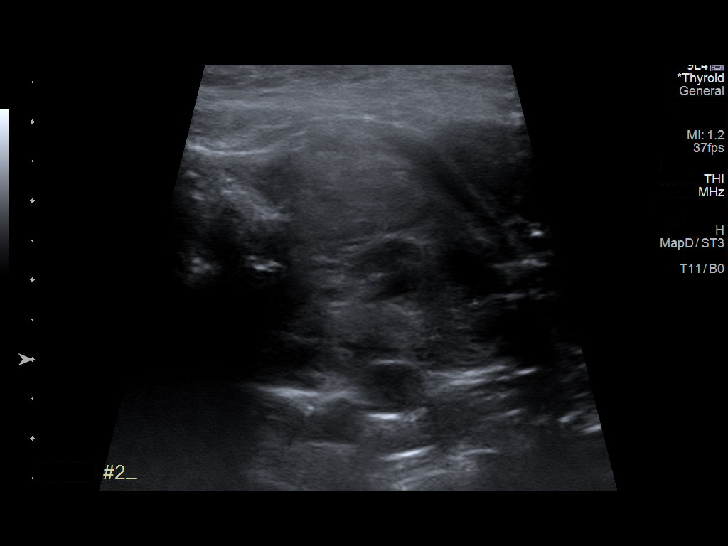
[im 10/19]
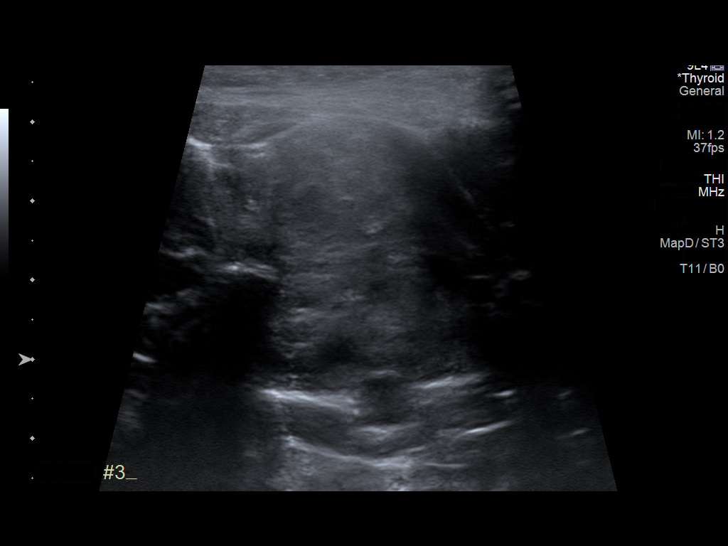
[im 11/19]
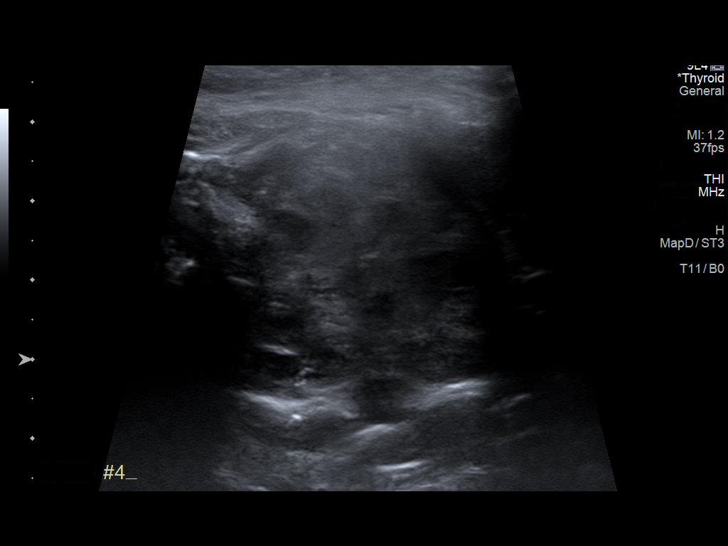
[im 13/19]
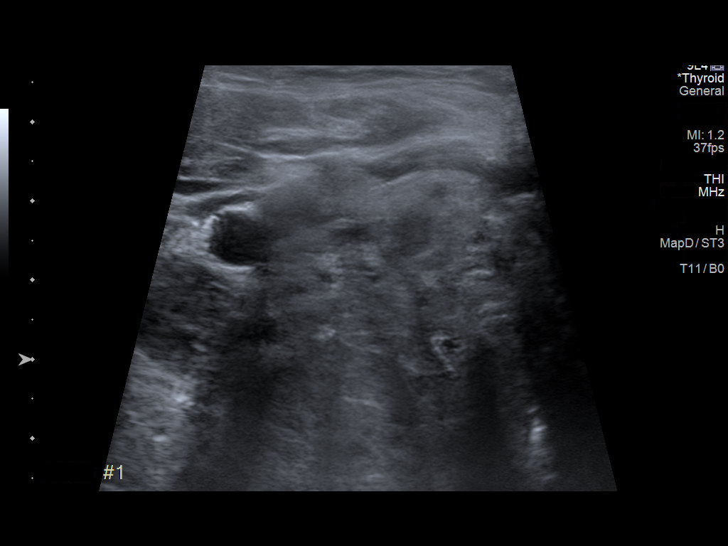
[im 14/19]
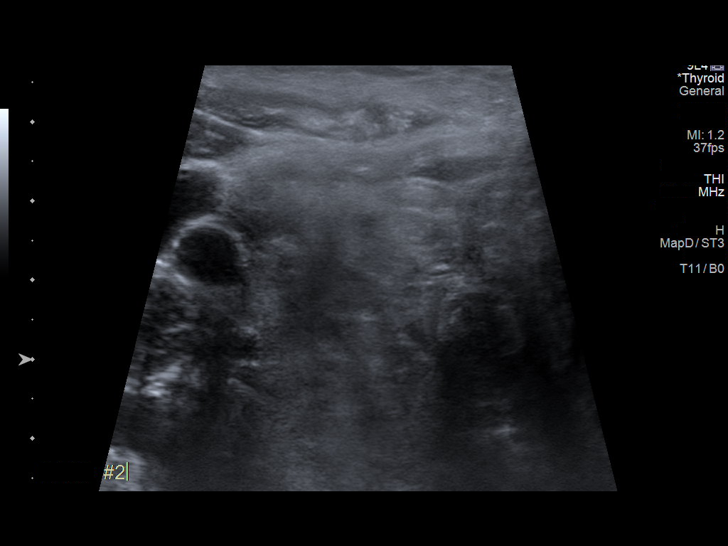
[im 16/19]
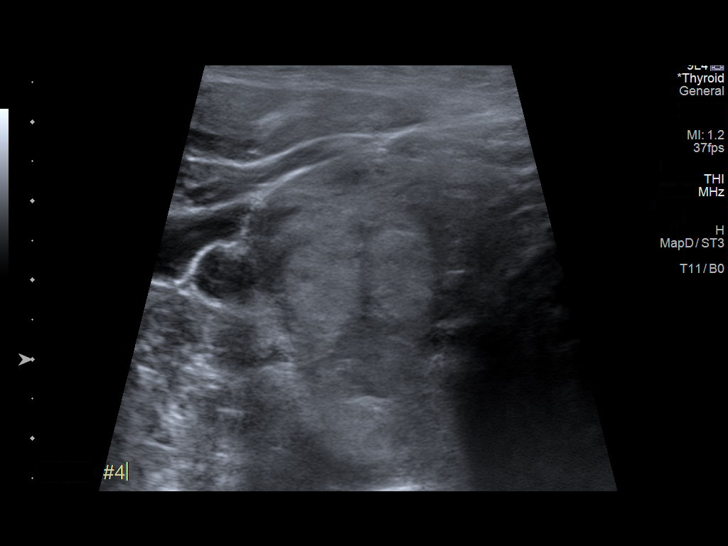
[im 17/19]
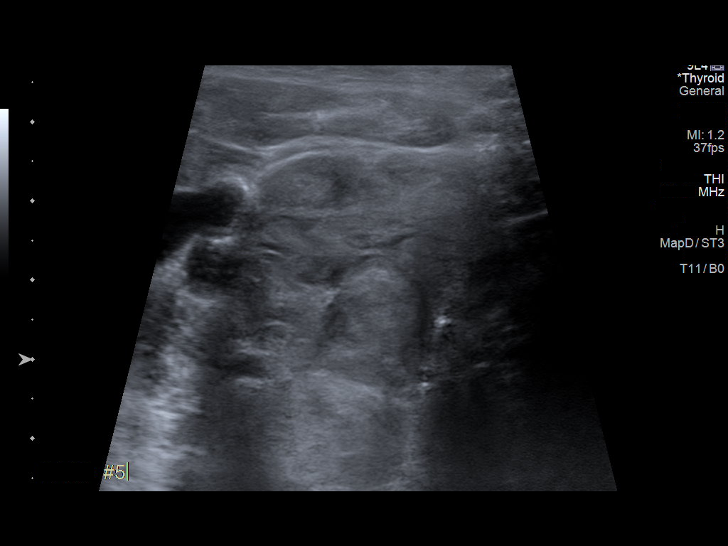
[im 19/19]
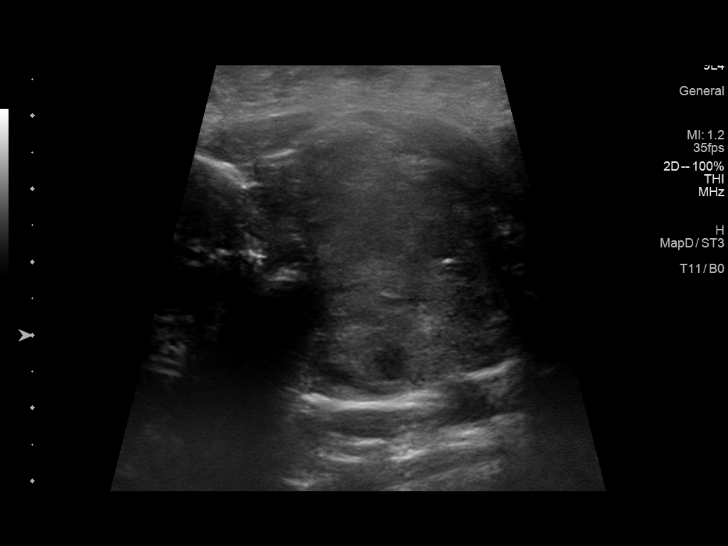

[13 of 19 positions shown; findings below may reference images not displayed]

Pre-procedural ultrasound scanning demonstrated unchanged size and
appearance of the indeterminate nodules within the left upper and
right inferior gland

The procedure was planned. The neck was prepped in the usual sterile
fashion, and a sterile drape was applied covering the operative
field. A timeout was performed prior to the initiation of the
procedure. Local anesthesia was provided with 1% lidocaine.

Under direct ultrasound guidance, 5 FNA biopsies were performed of
the goitrous nodule within the left superior gland with a 25 gauge
needle. Two biopsy specimens were reserved for Afirma testing.
Multiple ultrasound images were saved for procedural documentation
purposes. The samples were prepared and submitted to pathology.

Under direct ultrasound guidance, 5 FNA biopsies were performed of
the goitrous nodule within the right inferior gland with a 25 gauge
needle. Multiple ultrasound images were saved for procedural
documentation purposes. Two samples were reserved for Afirma
testing. The samples were prepared and submitted to pathology.

Limited post procedural scanning was negative for hematoma or
additional complication. Dressings were placed. The patient
tolerated the above procedures procedure well without immediate
postprocedural complication.
FINDINGS: Nodule reference number based on prior diagnostic ultrasound: 2

Maximum size: 4.4 cm

Location: Right; Inferior

ACR TI-RADS risk category: TR3 (3 points)

Reason for biopsy: meets ACR TI-RADS criteria

_________________________________________________________

Nodule reference number based on prior diagnostic ultrasound: 3

Maximum size: 4.5 cm

Location: Left; Superior

ACR TI-RADS risk category: TR3 (3 points)

Reason for biopsy: meets ACR TI-RADS criteria

Ultrasound imaging confirms appropriate placement of the needles
within the thyroid nodule.
IMPRESSION: 1. Technically successful ultrasound guided fine needle aspiration
of vague goitrous nodule versus pseudo nodule in the left superior
thyroid gland.
2. Technically successful ultrasound guided fine needle aspiration
of vague goitrous nodule versus pseudo nodule in the right inferior
thyroid gland. Of note, this nodule was very painful during the
biopsy procedure.

## 2022-08-15 IMAGING — US US FNA BIOPSY THYROID 1ST LESION
1 series · 13 of 19 positions shown · non-contrast
Comparison: Prior thyroid ultrasound 06/25/2021

MEDICATIONS:
None

COMPLICATIONS:
None immediate.

INDICATION: Indeterminate thyroid nodules

EXAM:
ULTRASOUND GUIDED FINE NEEDLE ASPIRATION OF INDETERMINATE THYROID
NODULE
TECHNIQUE: Informed written consent was obtained from the patient after a
discussion of the risks, benefits and alternatives to treatment.
Questions regarding the procedure were encouraged and answered. A
timeout was performed prior to the initiation of the procedure.

[Series 1: us fna biopsy thyroid 1st lesion · 0.09mm/px · 19 acquisitions, 13 frames shown]
[im 1/19]
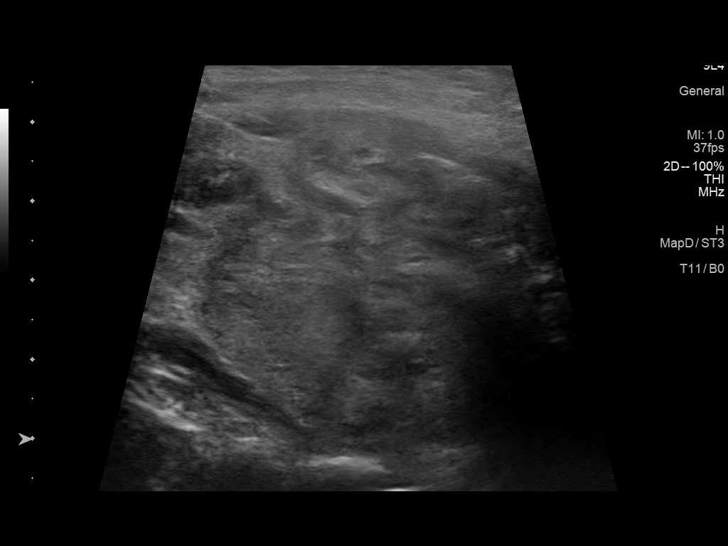
[im 3/19]
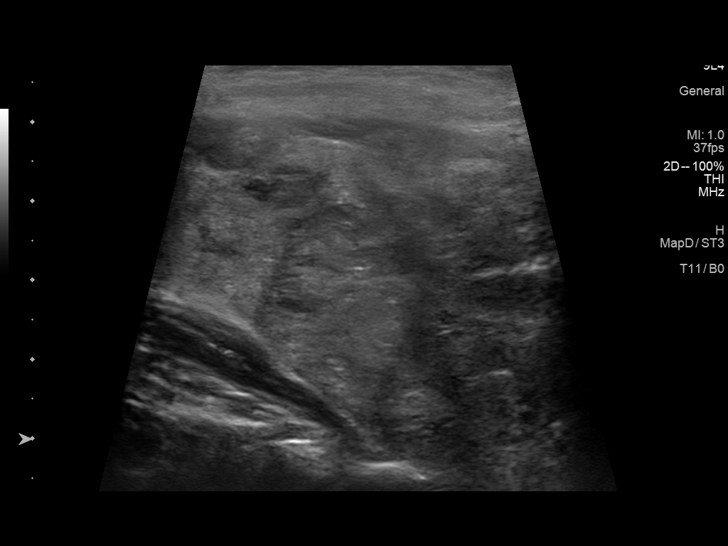
[im 4/19]
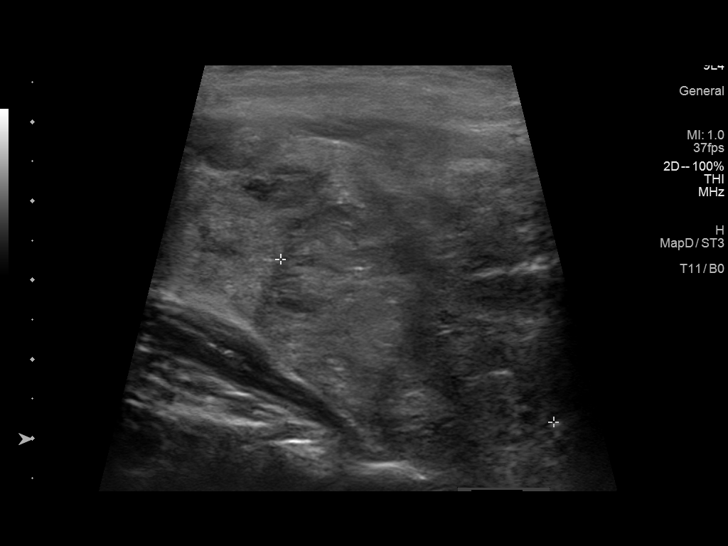
[im 6/19]
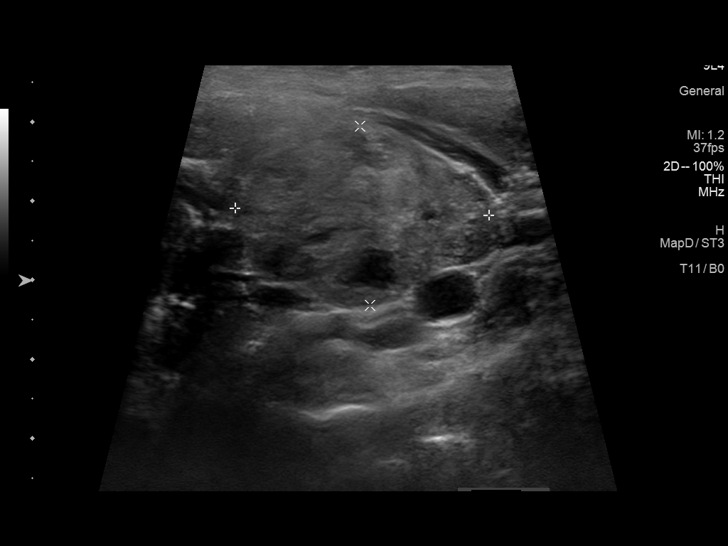
[im 7/19]
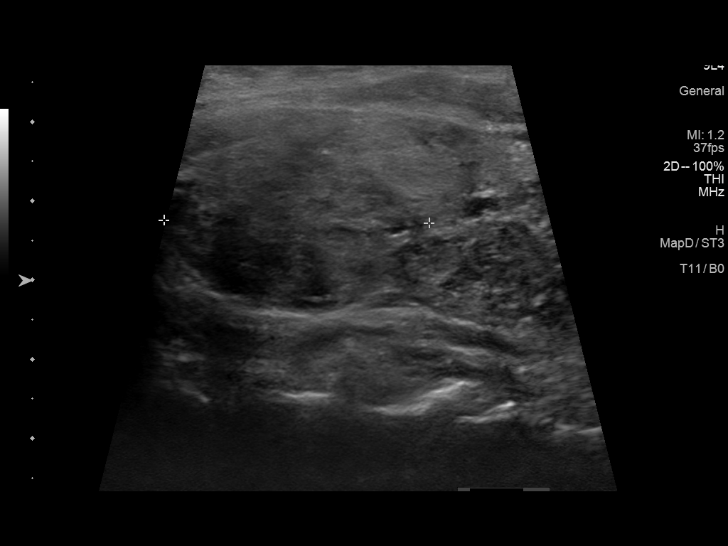
[im 9/19]
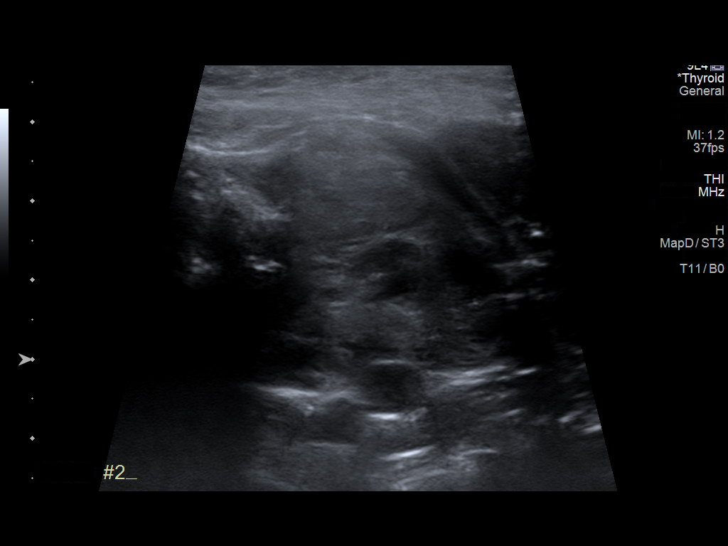
[im 10/19]
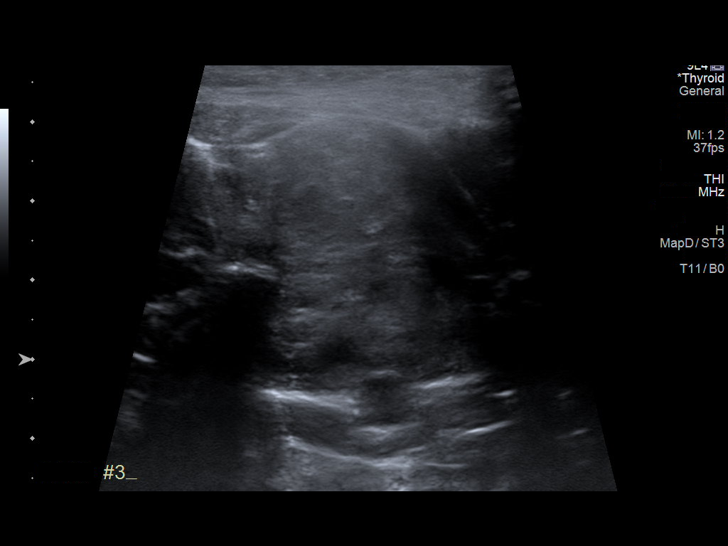
[im 11/19]
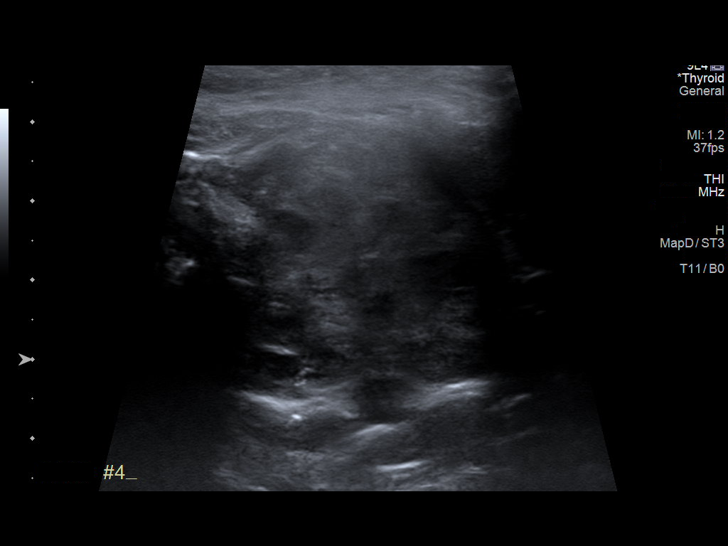
[im 13/19]
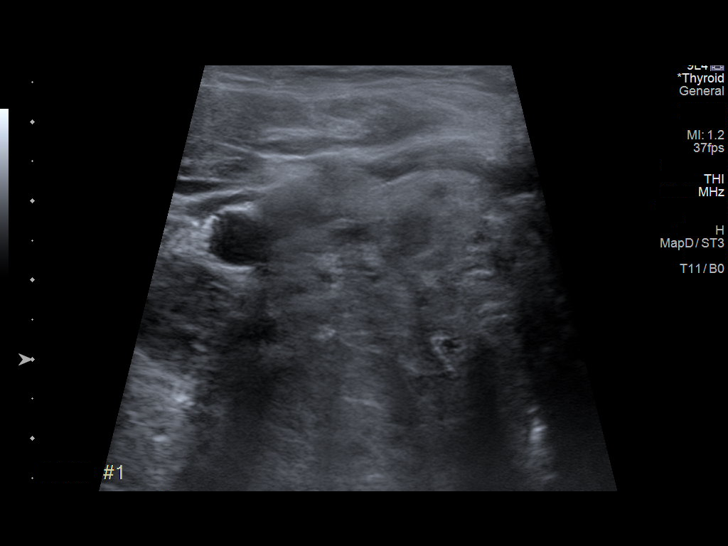
[im 14/19]
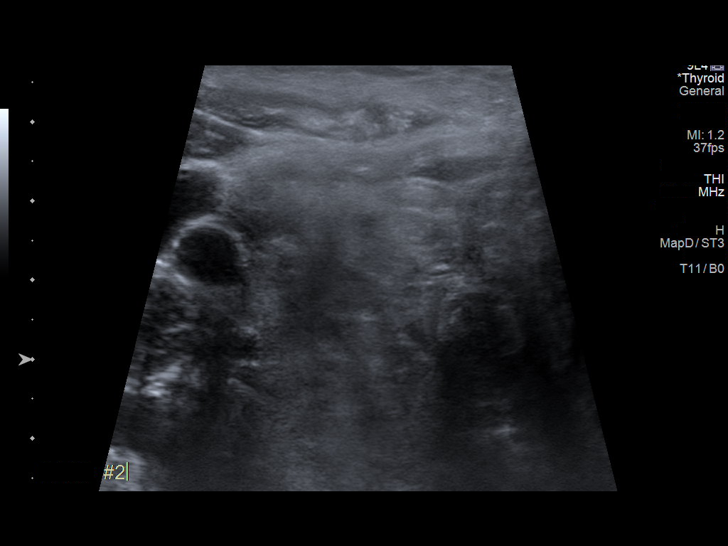
[im 16/19]
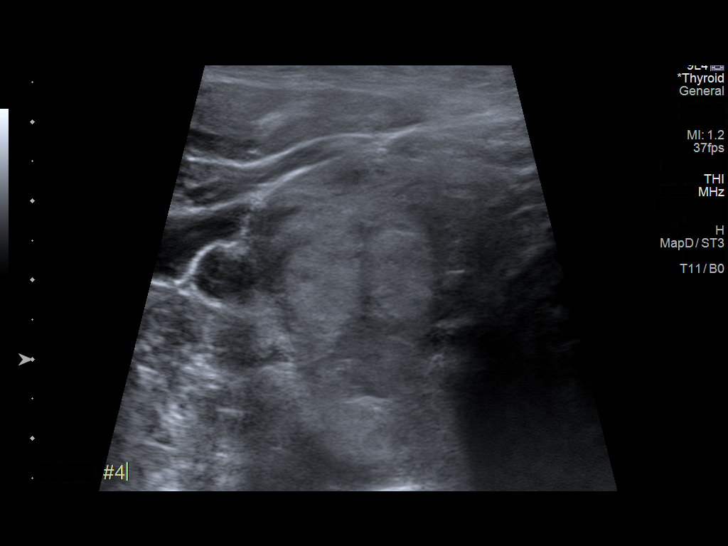
[im 17/19]
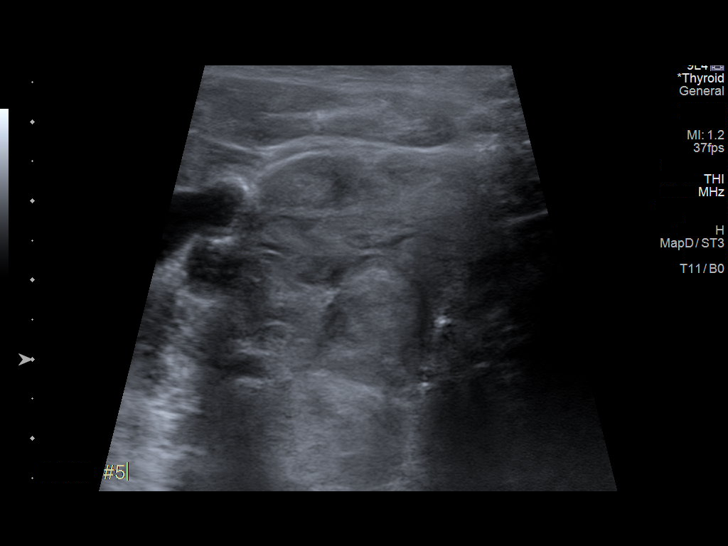
[im 19/19]
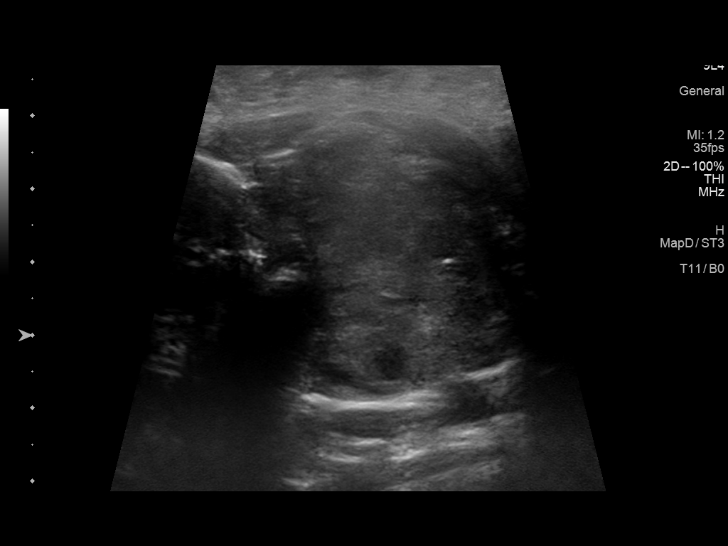

[13 of 19 positions shown; findings below may reference images not displayed]

Pre-procedural ultrasound scanning demonstrated unchanged size and
appearance of the indeterminate nodules within the left upper and
right inferior gland

The procedure was planned. The neck was prepped in the usual sterile
fashion, and a sterile drape was applied covering the operative
field. A timeout was performed prior to the initiation of the
procedure. Local anesthesia was provided with 1% lidocaine.

Under direct ultrasound guidance, 5 FNA biopsies were performed of
the goitrous nodule within the left superior gland with a 25 gauge
needle. Two biopsy specimens were reserved for Afirma testing.
Multiple ultrasound images were saved for procedural documentation
purposes. The samples were prepared and submitted to pathology.

Under direct ultrasound guidance, 5 FNA biopsies were performed of
the goitrous nodule within the right inferior gland with a 25 gauge
needle. Multiple ultrasound images were saved for procedural
documentation purposes. Two samples were reserved for Afirma
testing. The samples were prepared and submitted to pathology.

Limited post procedural scanning was negative for hematoma or
additional complication. Dressings were placed. The patient
tolerated the above procedures procedure well without immediate
postprocedural complication.
FINDINGS: Nodule reference number based on prior diagnostic ultrasound: 2

Maximum size: 4.4 cm

Location: Right; Inferior

ACR TI-RADS risk category: TR3 (3 points)

Reason for biopsy: meets ACR TI-RADS criteria

_________________________________________________________

Nodule reference number based on prior diagnostic ultrasound: 3

Maximum size: 4.5 cm

Location: Left; Superior

ACR TI-RADS risk category: TR3 (3 points)

Reason for biopsy: meets ACR TI-RADS criteria

Ultrasound imaging confirms appropriate placement of the needles
within the thyroid nodule.
IMPRESSION: 1. Technically successful ultrasound guided fine needle aspiration
of vague goitrous nodule versus pseudo nodule in the left superior
thyroid gland.
2. Technically successful ultrasound guided fine needle aspiration
of vague goitrous nodule versus pseudo nodule in the right inferior
thyroid gland. Of note, this nodule was very painful during the
biopsy procedure.

## 2022-08-24 ENCOUNTER — Other Ambulatory Visit: Payer: Self-pay | Admitting: Primary Care

## 2022-09-15 DIAGNOSIS — G4733 Obstructive sleep apnea (adult) (pediatric): Secondary | ICD-10-CM | POA: Diagnosis not present

## 2022-09-23 ENCOUNTER — Other Ambulatory Visit: Payer: Self-pay | Admitting: Nurse Practitioner

## 2022-09-23 DIAGNOSIS — K5900 Constipation, unspecified: Secondary | ICD-10-CM

## 2022-11-03 ENCOUNTER — Other Ambulatory Visit: Payer: Self-pay | Admitting: Nurse Practitioner

## 2022-11-03 DIAGNOSIS — Z1231 Encounter for screening mammogram for malignant neoplasm of breast: Secondary | ICD-10-CM

## 2022-11-25 ENCOUNTER — Ambulatory Visit
Admission: RE | Admit: 2022-11-25 | Discharge: 2022-11-25 | Disposition: A | Discharge status: Home / Self Care | Payer: Federal, State, Local not specified - PPO | Source: Ambulatory Visit | Attending: Nurse Practitioner | Admitting: Nurse Practitioner

## 2022-11-25 DIAGNOSIS — Z1231 Encounter for screening mammogram for malignant neoplasm of breast: Secondary | ICD-10-CM | POA: Diagnosis not present

## 2022-11-30 ENCOUNTER — Telehealth: Payer: Self-pay

## 2022-11-30 NOTE — Telephone Encounter (Signed)
PA renewal request received via CMM for Armodafinil 150MG  tablets  PA submitted to [employer] and has been APPROVED from 11/30/2022-11/30/2023  Key: [id number]

## 2022-12-21 ENCOUNTER — Encounter: Payer: Self-pay | Admitting: Primary Care

## 2022-12-21 ENCOUNTER — Ambulatory Visit: Payer: Federal, State, Local not specified - PPO | Admitting: Primary Care

## 2022-12-21 VITALS — BP 140/90 | HR 90 | Temp 97.7°F | Ht 64.0 in | Wt 205.4 lb

## 2022-12-21 DIAGNOSIS — G4730 Sleep apnea, unspecified: Secondary | ICD-10-CM | POA: Diagnosis not present

## 2022-12-21 DIAGNOSIS — I10 Essential (primary) hypertension: Secondary | ICD-10-CM | POA: Diagnosis not present

## 2022-12-21 DIAGNOSIS — G4733 Obstructive sleep apnea (adult) (pediatric): Secondary | ICD-10-CM

## 2022-12-21 DIAGNOSIS — G4710 Hypersomnia, unspecified: Secondary | ICD-10-CM | POA: Diagnosis not present

## 2022-12-21 MED ORDER — ARMODAFINIL 150 MG PO TABS: ORAL_TABLET | ORAL | 3 refills | Status: DC

## 2022-12-21 NOTE — Assessment & Plan Note (Signed)
-   Patient has moderate OSA. PSG in 2009 >> AHI 20/hour. She has been well maintained on CPAP since. No issues with mask fit or pressure settings. Current CPAP pressure 13cm h20; AHI 2.2/hour. She is eligible for new CPAP machine in Nov 2024.

## 2022-12-21 NOTE — Assessment & Plan Note (Signed)
-   Well controlled on CPAP and Nuvigil 150mg  daily qam - No residual daytime sleepiness or sleep attacks

## 2022-12-21 NOTE — Progress Notes (Signed)
Reviewed and agree with assessment/plan.   Coralyn Helling, MD [facility] Pulmonary/Critical Care 12/21/2022, 6:46 PM Pager:  [phone]

## 2022-12-21 NOTE — Assessment & Plan Note (Signed)
-   BP 140/90. Not taking Norvasc daily, advised she take medication as directed

## 2022-12-21 NOTE — Patient Instructions (Addendum)
Sleep apnea is well-controlled on current CPAP pressure settings No changes recommended today  BP 140/90 today, take Norvasc daily  Continue to work on weight loss efforts Aim to wear CPAP nightly for 4 to 6 hours or longer Avoid alcohol use or sedatives prior to bedtime as these can worsen underlying sleep apnea Driving if experiencing excessive daytime sleepiness fatigue Refill Nuvigil and renew CPAP supplies Looks like you'll be eligible for new CPAP in Novemebr 2024- send [place] a mychart message around this time  Follow-up 1 year with Beth NP   CPAP and BIPAP Information CPAP and BIPAP are methods that use air pressure to keep your airways open and to help you breathe well. CPAP and BIPAP use different amounts of pressure. Your health care provider will tell you whether CPAP or BIPAP would be more helpful for you. CPAP stands for "continuous positive airway pressure." With CPAP, the amount of pressure stays the same while you breathe in (inhale) and out (exhale). BIPAP stands for "bi-level positive airway pressure." With BIPAP, the amount of pressure will be higher when you inhale and lower when you exhale. This allows you to take larger breaths. CPAP or BIPAP may be used in the hospital, or your health care provider may want you to use it at home. You may need to have a sleep study before your health care provider can order a machine for you to use at home. What are the advantages? CPAP or BIPAP can be helpful if you have: Sleep apnea. Chronic obstructive pulmonary disease (COPD). Heart failure. Medical conditions that cause muscle weakness, including muscular dystrophy or amyotrophic lateral sclerosis (ALS). Other problems that cause breathing to be shallow, weak, abnormal, or difficult. CPAP and BIPAP are most commonly used for obstructive sleep apnea (OSA) to keep the airways from collapsing when the muscles relax during sleep. What are the risks? Generally, this is a safe treatment.  However, problems may occur, including: Irritated skin or skin sores if the mask does not fit properly. Dry or stuffy nose or nosebleeds. Dry mouth. Feeling gassy or bloated. Sinus or lung infection if the equipment is not cleaned properly. When should CPAP or BIPAP be used? In most cases, the mask only needs to be worn during sleep. Generally, the mask needs to be worn throughout the night and during any daytime naps. People with certain medical conditions may also need to wear the mask at other times, such as when they are awake. Follow instructions from your health care provider about when to use the machine. What happens during CPAP or BIPAP?  Both CPAP and BIPAP are provided by a small machine with a flexible plastic tube that attaches to a plastic mask that you wear. Air is blown through the mask into your nose or mouth. The amount of pressure that is used to blow the air can be adjusted on the machine. Your health care provider will set the pressure setting and help you find the best mask for you. Tips for using the mask Because the mask needs to be snug, some people feel trapped or closed-in (claustrophobic) when first using the mask. If you feel this way, you may need to get used to the mask. One way to do this is to hold the mask loosely over your nose or mouth and then gradually apply the mask more snugly. You can also gradually increase the amount of time that you use the mask. Masks are available in various types and sizes. If your mask  does not fit well, talk with your health care provider about getting a different one. Some common types of masks include: Full face masks, which fit over the mouth and nose. Nasal masks, which fit over the nose. Nasal pillow or prong masks, which fit into the nostrils. If you are using a mask that fits over your nose and you tend to breathe through your mouth, a chin strap may be applied to help keep your mouth closed. Use a skin barrier to protect your  skin as told by your health care provider. Some CPAP and BIPAP machines have alarms that may sound if the mask comes off or develops a leak. If you have trouble with the mask, it is very important that you talk with your health care provider about finding a way to make the mask easier to tolerate. Do not stop using the mask. There could be a negative impact on your health if you stop using the mask. Tips for using the machine Place your CPAP or BIPAP machine on a secure table or stand near an electrical outlet. Know where the on/off switch is on the machine. Follow instructions from your health care provider about how to set the pressure on your machine and when you should use it. Do not eat or drink while the CPAP or BIPAP machine is on. Food or fluids could get pushed into your lungs by the pressure of the CPAP or BIPAP. For home use, CPAP and BIPAP machines can be rented or purchased through home health care companies. Many different brands of machines are available. Renting a machine before purchasing may help you find out which particular machine works well for you. Your health insurance company may also decide which machine you may get. Keep the CPAP or BIPAP machine and attachments clean. Ask your health care provider for specific instructions. Check the humidifier if you have a dry stuffy nose or nosebleeds. Make sure it is working correctly. Follow these instructions at home: Take over-the-counter and prescription medicines only as told by your health care provider. Ask if you can take sinus medicine if your sinuses are blocked. Do not use any products that contain nicotine or tobacco. These products include cigarettes, chewing tobacco, and vaping devices, such as e-cigarettes. If you need help quitting, ask your health care provider. Keep all follow-up visits. This is important. Contact a health care provider if: You have redness or pressure sores on your head, face, mouth, or nose from the  mask or head gear. You have trouble using the CPAP or BIPAP machine. You cannot tolerate wearing the CPAP or BIPAP mask. Someone tells you that you snore even when wearing your CPAP or BIPAP. Get help right away if: You have trouble breathing. You feel confused. Summary CPAP and BIPAP are methods that use air pressure to keep your airways open and to help you breathe well. If you have trouble with the mask, it is very important that you talk with your health care provider about finding a way to make the mask easier to tolerate. Do not stop using the mask. There could be a negative impact to your health if you stop using the mask. Follow instructions from your health care provider about when to use the machine. This information is not intended to replace advice given to you by your health care provider. Make sure you discuss any questions you have with your health care provider. Document Revised: 02/12/2021 Document Reviewed: 06/14/2020 Elsevier Patient Education  2023 [employer].

## 2022-12-21 NOTE — Progress Notes (Signed)
@Patient  ID: Diane Townsend, female    DOB: [birth year], 47 y.o.   MRN: [id number]  Chief Complaint  Patient presents with   Follow-up    CPAP is Wonderful! No problems with the mask or pressure.    Referring provider: Eden Emms, NP  HPI: 47 year old female, never smoked.  Past medical history significant for OSA, pretension, obesity.  Patient of Dr. Craige Cotta.   Previous [facility] pulmonary encounter: 12/27/2020 Patient presents today for 1 year follow-up OSA/daytime sleepiness.  She is doing very well. No acute complaints. No issues with pressure setting or mask fit. She is 100% compliant with CPAP, using on average 8 hours and 42 minutes a night.  CPAP pressure 13 cm H2O, residual AHI 2.3. She takes Armodafinil 150mg  daily. Epworth score 3/40. She is going through some adjustments at home, her husband was recently placed in Skilled nursing facility d/t his past stroke.   Airview download 11/25/20-12/24/20: 30/30 days (100%); 30 days (100%) > 4 hours Average usage 8 hours 42 mins Pressure 13 cm h20 Airleaks 2.9L/min (95%) AHI 2.3   12/29/2021 Patient presents today for a 1 year follow-up OSA.  She is 100% compliant with CPAP use from 11/26/2021 - 12/25/2021.  Current pressure settings 13 cm H2O with residual AHI 1.7/hr. No issues sleeping at night. She gets 8 hours of sleep a night. Her husband past in February, she was her main care taker. She is maintained on Armodafinil (Nuvigil) 150mg  daily for residual daytime sleepiness in patient with OSA.  She is more alert during the daytime when taking medication. No residual daytime sleepiness. Epworth score 2/24.  Airview download 11/26/2021-12/25/2021 Usage 30/30 days; 100% greater than 4 hours Average usage 8 hours 37 minutes Pressure 13 cm H2O Air leaks 8.6 L/min (95%) AHI 1.7  12/21/2022 Patient presents today for annual OSA follow-up.  Patient is doing well today without acute complaints.  She is 100% compliant with CPAP and reports benefit from use.  Sleeping well at night.  Average usage 9 hours a night.  Current CPAP pressure 13 cm H2O.  She will be eligible for a new CPAP in November 2024. No issues with mask fit or pressure settings.  She takes Nuvigil 150 mg daily in the morning.  She has no residual daytime sleepiness or sudden sleep attacks.  Her blood pressure slightly elevated today at 140/90, she did not take Norvasc today.  She states she does not take blood pressure medication every day.  She is working on weight loss efforts, her weight is down 5 to 10 pounds since last year.    Airview download 11/18/22-12/17/22 30/30 days (100%) greater than 4 hours Average usage 9 hours 3 minutes Pressure 13 cm H2O Air leaks 5.4L/min AHI 2.2    No Known Allergies  Immunization History  Administered Date(s) Administered   Influenza Inj Mdck Quad With Preservative 05/29/2019   Influenza,inj,Quad PF,6+ Mos 05/23/2018, 05/29/2020, 06/02/2021, 06/03/2022   Influenza,inj,quad, With Preservative 05/28/2016   PFIZER(Purple Top)SARS-COV-2 Vaccination 10/27/2019, 11/16/2019   Pneumococcal Polysaccharide-23 07/20/2008   Tdap 05/23/2018    Past Medical History:  Diagnosis Date   Hypertensive disorder 05/09/2015   IBS (irritable bowel syndrome)    Obstructive sleep apnea syndrome 04/26/2014    Tobacco History: Social History   Tobacco Use  Smoking Status Never  Smokeless Tobacco Never   Counseling given: Not Answered   Outpatient Medications Prior to Visit  Medication Sig Dispense Refill   amLODipine (NORVASC) 5 MG tablet Take 1 tablet by  mouth daily 90 tablet 2   benzonatate (TESSALON) 100 MG capsule Take 1 capsule (100 mg total) by mouth every 8 (eight) hours. 21 capsule 0   LINZESS 145 MCG CAPS capsule TAKE 1 CAPSULE(145 MCG) BY MOUTH DAILY BEFORE BREAKFAST 30 capsule 5   Multiple Vitamins-Minerals (MULTIVITAMIN ADULT PO) Vitamin-Mineral Supplement oral liquid     Phenylephrine-DM-GG-APAP (MUCINEX FAST-MAX SEVERE COLD PO) Take by  mouth. Take every 4 to 6 hours     Armodafinil 150 MG tablet TAKE 1 TABLET BY MOUTH DAILY AT 9 AM 30 tablet 3   No facility-administered medications prior to visit.    Review of Systems  Review of Systems  Constitutional: Negative.  Negative for fatigue.  Respiratory: Negative.    Cardiovascular: Negative.   Psychiatric/Behavioral:  Negative for sleep disturbance.    Physical Exam  BP (!) 142/86 (BP Location: Left Arm, Cuff Size: Large)   Pulse 90   Temp 97.7 F (36.5 C)   Ht 5\' 4"  (1.626 m)   Wt 205 lb 6.4 oz (93.2 kg)   SpO2 100%   BMI 35.26 kg/m  Physical Exam Constitutional:      Appearance: Normal appearance.  HENT:     Head: Normocephalic and atraumatic.     Mouth/Throat:     Mouth: Mucous membranes are moist.     Pharynx: Oropharynx is clear.  Cardiovascular:     Rate and Rhythm: Normal rate and regular rhythm.     Comments: RRR Pulmonary:     Effort: Pulmonary effort is normal.     Breath sounds: Normal breath sounds. No wheezing, rhonchi or rales.  Musculoskeletal:        General: Normal range of motion.  Skin:    General: Skin is warm and dry.  Neurological:     General: No focal deficit present.     Mental Status: She is alert and oriented to person, place, and time. Mental status is at baseline.  Psychiatric:        Mood and Affect: Mood normal.        Behavior: Behavior normal.        Thought Content: Thought content normal.        Judgment: Judgment normal.      Lab Results:  CBC    Component Value Date/Time   WBC 6.6 06/03/2022 1643   RBC 5.09 06/03/2022 1643   HGB 14.0 06/03/2022 1643   HGB 13.8 12/01/2019 1459   HCT 41.1 06/03/2022 1643   HCT 41.8 12/01/2019 1459   PLT 263.0 06/03/2022 1643   PLT 290 12/01/2019 1459   MCV 80.9 06/03/2022 1643   MCV 81 12/01/2019 1459   MCH 27.0 04/04/2022 2031   MCHC 34.0 06/03/2022 1643   RDW 13.4 06/03/2022 1643   RDW 13.6 12/01/2019 1459    BMET    Component Value Date/Time   NA 140  06/03/2022 1643   NA 142 12/01/2019 1459   K 4.1 06/03/2022 1643   CL 102 06/03/2022 1643   CO2 31 06/03/2022 1643   GLUCOSE 90 06/03/2022 1643   BUN 14 06/03/2022 1643   BUN 12 12/01/2019 1459   CREATININE 0.83 06/03/2022 1643   CALCIUM 10.0 06/03/2022 1643   GFRNONAA >60 04/04/2022 2031   GFRAA 102 12/01/2019 1459    BNP No results found for: "BNP"  ProBNP No results found for: "PROBNP"  Imaging: MM 3D SCREENING MAMMOGRAM BILATERAL BREAST  Result Date: 11/26/2022 CLINICAL DATA:  Screening. EXAM: DIGITAL SCREENING BILATERAL MAMMOGRAM  WITH TOMOSYNTHESIS AND CAD TECHNIQUE: Bilateral screening digital craniocaudal and mediolateral oblique mammograms were obtained. Bilateral screening digital breast tomosynthesis was performed. The images were evaluated with computer-aided detection. COMPARISON:  Previous exam(s). ACR Breast Density Category b: There are scattered areas of fibroglandular density. FINDINGS: There are no findings suspicious for malignancy. IMPRESSION: No mammographic evidence of malignancy. A result letter of this screening mammogram will be mailed directly to the patient. RECOMMENDATION: Screening mammogram in one year. (Code:[id number]) BI-RADS CATEGORY  1: Negative. Electronically Signed   By: Hulan Saas M.D.   On: 11/26/2022 16:26     Assessment & Plan:   Essential hypertension - BP 140/90. Not taking Norvasc daily, advised she take medication as directed  Obstructive sleep apnea syndrome - Patient has moderate OSA. PSG in 2009 >> AHI 20/hour. She has been well maintained on CPAP since. No issues with mask fit or pressure settings. Current CPAP pressure 13cm h20; AHI 2.2/hour. She is eligible for new CPAP machine in Nov 2024.   Sleep apnea with hypersomnolence - Well controlled on CPAP and Nuvigil 150mg  daily qam - No residual daytime sleepiness or sleep attacks   Glenford Bayley, NP 12/21/2022

## 2022-12-28 DIAGNOSIS — G4733 Obstructive sleep apnea (adult) (pediatric): Secondary | ICD-10-CM | POA: Diagnosis not present

## 2022-12-30 ENCOUNTER — Ambulatory Visit: Payer: Federal, State, Local not specified - PPO | Admitting: Primary Care

## 2023-01-01 ENCOUNTER — Ambulatory Visit: Payer: Federal, State, Local not specified - PPO | Admitting: Primary Care

## 2023-01-27 DIAGNOSIS — G4733 Obstructive sleep apnea (adult) (pediatric): Secondary | ICD-10-CM | POA: Diagnosis not present

## 2023-02-22 ENCOUNTER — Telehealth: Payer: Self-pay

## 2023-02-22 ENCOUNTER — Other Ambulatory Visit (HOSPITAL_COMMUNITY): Payer: Self-pay

## 2023-02-22 NOTE — Telephone Encounter (Signed)
Pharmacy Patient Advocate Encounter  Received notification from [facility] that Prior Authorization for Linzess has been APPROVED from 01/23/23 to 02/22/24. Ran test claim, Copay is $refill too soon  PA was approved for a 90 day supply.  Phone # [phone]

## 2023-02-22 NOTE — Telephone Encounter (Signed)
Pharmacy Patient Advocate Encounter   Received notification from CoverMyMeds that prior authorization for Linzess is required/requested.   Insurance verification completed.   The patient is insured through [facility] .   Per test claim: PA required; PA submitted to [facility] via Phone Key/confirmation #/[id number] Status is pending

## 2023-02-23 NOTE — Telephone Encounter (Signed)
Called pt to inform her that the Linzess prescription has been approved and that she can go pick up the prescription. Pt has no questions or concerns.  Diane Townsend, CMA

## 2023-02-27 DIAGNOSIS — G4733 Obstructive sleep apnea (adult) (pediatric): Secondary | ICD-10-CM | POA: Diagnosis not present

## 2023-03-01 ENCOUNTER — Encounter: Payer: Self-pay | Admitting: Nurse Practitioner

## 2023-03-01 DIAGNOSIS — R232 Flushing: Secondary | ICD-10-CM

## 2023-03-03 ENCOUNTER — Telehealth: Payer: Federal, State, Local not specified - PPO | Admitting: Physician Assistant

## 2023-03-03 DIAGNOSIS — U071 COVID-19: Secondary | ICD-10-CM | POA: Diagnosis not present

## 2023-03-03 MED ORDER — NIRMATRELVIR/RITONAVIR (PAXLOVID)TABLET: 3.0000 | ORAL_TABLET | Freq: Two times a day (BID) | ORAL | 0 refills | Status: AC

## 2023-03-03 MED ORDER — BENZONATATE 100 MG PO CAPS: 100.0000 mg | ORAL_CAPSULE | Freq: Three times a day (TID) | ORAL | 0 refills | Status: DC | PRN

## 2023-03-03 NOTE — Patient Instructions (Signed)
Diane Townsend, thank you for joining Piedad Climes, PA-C for today's virtual visit.  While this provider is not your primary care provider (PCP), if your PCP is located in our provider database this encounter information will be shared with them immediately following your visit.   A [facility] MyChart account gives you access to today's visit and all your visits, tests, and labs performed at [facility] " click here if you don't have a [facility] MyChart account or go to [email or web address]  Consent: (Patient) Diane Townsend provided verbal consent for this virtual visit at the beginning of the encounter.  Current Medications:  Current Outpatient Medications:    amLODipine (NORVASC) 5 MG tablet, Take 1 tablet by mouth daily, Disp: 90 tablet, Rfl: 2   Armodafinil 150 MG tablet, Take 1 tablet by mouth daily at 9am, Disp: 30 tablet, Rfl: 3   LINZESS 145 MCG CAPS capsule, TAKE 1 CAPSULE(145 MCG) BY MOUTH DAILY BEFORE BREAKFAST, Disp: 30 capsule, Rfl: 5   Multiple Vitamins-Minerals (MULTIVITAMIN ADULT PO), Vitamin-Mineral Supplement oral liquid, Disp: , Rfl:    Medications ordered in this encounter:  No orders of the defined types were placed in this encounter.    *If you need refills on other medications prior to your next appointment, please contact your pharmacy*  Follow-Up: Call back or seek an in-person evaluation if the symptoms worsen or if the condition fails to improve as anticipated.   Virtual Care [phone]  Care Instructions: Please keep well-hydrated and get plenty of rest. Start a saline nasal rinse to flush out your nasal passages. You can use plain Mucinex to help thin congestion. If you have a humidifier, running in the bedroom at night. I want you to start OTC vitamin D3 1000 units daily, vitamin C 1000 mg daily, and a zinc supplement. Please take prescribed medications as directed.  Isolation Instructions: You are to isolate  at home until you have been fever free for at least 24 hours without a fever-reducing medication, and symptoms have been steadily improving for 24 hours. At that time,  you can end isolation but need to mask for an additional 5 days.   If you must be around other household members who do not have symptoms, you need to make sure that both you and the family members are masking consistently with a high-quality mask.  If you note any worsening of symptoms despite treatment, please seek an in-person evaluation ASAP. If you note any significant shortness of breath or any chest pain, please seek ER evaluation. Please do not delay care!   COVID-19: What to Do if You Are Sick If you test positive and are an older adult or someone who is at high risk of getting very sick from COVID-19, treatment may be available. Contact a healthcare provider right away after a positive test to determine if you are eligible, even if your symptoms are mild right now. You can also visit a Test to Treat location and, if eligible, receive a prescription from a provider. Don't delay: Treatment must be started within the first few days to be effective. If you have a fever, cough, or other symptoms, you might have COVID-19. Most people have mild illness and are able to recover at home. If you are sick: Keep track of your symptoms. If you have an emergency warning sign (including trouble breathing), call 911. Steps to help prevent the spread of COVID-19 if you are sick If you are sick with COVID-19 or think  you might have COVID-19, follow the steps below to care for yourself and to help protect other people in your home and community. Stay home except to get medical care Stay home. Most people with COVID-19 have mild illness and can recover at home without medical care. Do not leave your home, except to get medical care. Do not visit public areas and do not go to places where you are unable to wear a mask. Take care of yourself. Get  rest and stay hydrated. Take over-the-counter medicines, such as acetaminophen, to help you feel better. Stay in touch with your doctor. Call before you get medical care. Be sure to get care if you have trouble breathing, or have any other emergency warning signs, or if you think it is an emergency. Avoid public transportation, ride-sharing, or taxis if possible. Get tested If you have symptoms of COVID-19, get tested. While waiting for test results, stay away from others, including staying apart from those living in your household. Get tested as soon as possible after your symptoms start. Treatments may be available for people with COVID-19 who are at risk for becoming very sick. Don't delay: Treatment must be started early to be effective--some treatments must begin within 5 days of your first symptoms. Contact your healthcare provider right away if your test result is positive to determine if you are eligible. Self-tests are one of several options for testing for the virus that causes COVID-19 and may be more convenient than laboratory-based tests and point-of-care tests. Ask your healthcare provider or your local health department if you need help interpreting your test results. You can visit your state, tribal, local, and territorial health department's website to look for the latest local information on testing sites. Separate yourself from other people As much as possible, stay in a specific room and away from other people and pets in your home. If possible, you should use a separate bathroom. If you need to be around other people or animals in or outside of the home, wear a well-fitting mask. Tell your close contacts that they may have been exposed to COVID-19. An infected person can spread COVID-19 starting 48 hours (or 2 days) before the person has any symptoms or tests positive. By letting your close contacts know they may have been exposed to COVID-19, you are helping to protect everyone. See  COVID-19 and Animals if you have questions about pets. If you are diagnosed with COVID-19, someone from the health department may call you. Answer the call to slow the spread. Monitor your symptoms Symptoms of COVID-19 include fever, cough, or other symptoms. Follow care instructions from your healthcare provider and local health department. Your local health authorities may give instructions on checking your symptoms and reporting information. When to seek emergency medical attention Look for emergency warning signs* for COVID-19. If someone is showing any of these signs, seek emergency medical care immediately: Trouble breathing Persistent pain or pressure in the chest New confusion Inability to wake or stay awake Pale, gray, or blue-colored skin, lips, or nail beds, depending on skin tone *This list is not all possible symptoms. Please call your medical provider for any other symptoms that are severe or concerning to you. Call 911 or call ahead to your local emergency facility: Notify the operator that you are seeking care for someone who has or may have COVID-19. Call ahead before visiting your doctor Call ahead. Many medical visits for routine care are being postponed or done by phone or telemedicine. If  you have a medical appointment that cannot be postponed, call your doctor's office, and tell them you have or may have COVID-19. This will help the office protect themselves and other patients. If you are sick, wear a well-fitting mask You should wear a mask if you must be around other people or animals, including pets (even at home). Wear a mask with the best fit, protection, and comfort for you. You don't need to wear the mask if you are alone. If you can't put on a mask (because of trouble breathing, for example), cover your coughs and sneezes in some other way. Try to stay at least 6 feet away from other people. This will help protect the people around you. Masks should not be placed on  young children under age 65 years, anyone who has trouble breathing, or anyone who is not able to remove the mask without help. Cover your coughs and sneezes Cover your mouth and nose with a tissue when you cough or sneeze. Throw away used tissues in a lined trash can. Immediately wash your hands with soap and water for at least 20 seconds. If soap and water are not available, clean your hands with an alcohol-based hand sanitizer that contains at least 60% alcohol. Clean your hands often Wash your hands often with soap and water for at least 20 seconds. This is especially important after blowing your nose, coughing, or sneezing; going to the bathroom; and before eating or preparing food. Use hand sanitizer if soap and water are not available. Use an alcohol-based hand sanitizer with at least 60% alcohol, covering all surfaces of your hands and rubbing them together until they feel dry. Soap and water are the best option, especially if hands are visibly dirty. Avoid touching your eyes, nose, and mouth with unwashed hands. Handwashing Tips Avoid sharing personal household items Do not share dishes, drinking glasses, cups, eating utensils, towels, or bedding with other people in your home. Wash these items thoroughly after using them with soap and water or put in the dishwasher. Clean surfaces in your home regularly Clean and disinfect high-touch surfaces (for example, doorknobs, tables, handles, light switches, and countertops) in your "sick room" and bathroom. In shared spaces, you should clean and disinfect surfaces and items after each use by the person who is ill. If you are sick and cannot clean, a caregiver or other person should only clean and disinfect the area around you (such as your bedroom and bathroom) on an as needed basis. Your caregiver/other person should wait as long as possible (at least several hours) and wear a mask before entering, cleaning, and disinfecting shared spaces that you  use. Clean and disinfect areas that may have blood, stool, or body fluids on them. Use household cleaners and disinfectants. Clean visible dirty surfaces with household cleaners containing soap or detergent. Then, use a household disinfectant. Use a product from [employer] List N: Disinfectants for Coronavirus (COVID-19). Be sure to follow the instructions on the label to ensure safe and effective use of the product. Many products recommend keeping the surface wet with a disinfectant for a certain period of time (look at "contact time" on the product label). You may also need to wear personal protective equipment, such as gloves, depending on the directions on the product label. Immediately after disinfecting, wash your hands with soap and water for 20 seconds. For completed guidance on cleaning and disinfecting your home, visit Complete Disinfection Guidance. Take steps to improve ventilation at home Improve ventilation (air  flow) at home to help prevent from spreading COVID-19 to other people in your household. Clear out COVID-19 virus particles in the air by opening windows, using air filters, and turning on fans in your home. Use this interactive tool to learn how to improve air flow in your home. When you can be around others after being sick with COVID-19 Deciding when you can be around others is different for different situations. Find out when you can safely end home isolation. For any additional questions about your care, contact your healthcare provider or state or local health department. 10/08/2020 Content source: [facility] ([facility]), Division of Viral Diseases This information is not intended to replace advice given to you by your health care provider. Make sure you discuss any questions you have with your health care provider. Document Revised: 11/21/2020 Document Reviewed: 11/21/2020 Elsevier Patient Education  2022 [employer].     If  you have been instructed to have an in-person evaluation today at a local Urgent Care facility, please use the link below. It will take you to a list of all of our available [facility], including address, phone number and hours of operation. Please do not delay care.  [facility]  If you or a family member do not have a primary care provider, use the link below to schedule a visit and establish care. When you choose a [facility] primary care physician or advanced practice provider, you gain a long-term partner in health. Find a Primary Care Provider  Learn more about [facility]'s in-office and virtual care options: [facility] - Get Care Now

## 2023-03-03 NOTE — Progress Notes (Signed)
Virtual Visit Consent   Diane Townsend, you are scheduled for a virtual visit with a [facility] provider today. Just as with appointments in the office, your consent must be obtained to participate. Your consent will be active for this visit and any virtual visit you may have with one of our providers in the next 365 days. If you have a MyChart account, a copy of this consent can be sent to you electronically.  As this is a virtual visit, video technology does not allow for your provider to perform a traditional examination. This may limit your provider's ability to fully assess your condition. If your provider identifies any concerns that need to be evaluated in person or the need to arrange testing (such as labs, EKG, etc.), we will make arrangements to do so. Although advances in technology are sophisticated, we cannot ensure that it will always work on either your end or our end. If the connection with a video visit is poor, the visit may have to be switched to a telephone visit. With either a video or telephone visit, we are not always able to ensure that we have a secure connection.  By engaging in this virtual visit, you consent to the provision of healthcare and authorize for your insurance to be billed (if applicable) for the services provided during this visit. Depending on your insurance coverage, you may receive a charge related to this service.  I need to obtain your verbal consent now. Are you willing to proceed with your visit today? Diane Townsend has provided verbal consent on 03/03/2023 for a virtual visit (video or telephone). Piedad Climes, [place]  Date: 03/03/2023 9:52 AM  Virtual Visit via Video Note   I, Piedad Climes, connected with  Diane Townsend  ([id number], 08/01/1975) on 03/03/23 at  9:45 AM EDT by a video-enabled telemedicine application and verified that I am speaking with the correct person using two identifiers.  Location: Patient: Virtual Visit Location  Patient: Home Provider: Virtual Visit Location Provider: Home Office   I discussed the limitations of evaluation and management by telemedicine and the availability of in person appointments. The patient expressed understanding and agreed to proceed.    History of Present Illness: Diane Townsend is a 47 y.o. who identifies as a female who was assigned female at birth, and is being seen today for COVID-19. Endorses 2.5 days of nasal congestion, head congestion, rhinorrhea, and cough. Started with Mucinex. Took a COVID test due to history which came back positive (tested last night). Denies chest pain or shortness of breath. So far, not noting chills or fever.   HPI: HPI  Problems:  Patient Active Problem List   Diagnosis Date Noted   Upper respiratory tract infection 06/03/2022   Thyroid nodule 06/03/2022   Sleep apnea with hypersomnolence 12/29/2021   Preventative health care 06/02/2021   Chronic idiopathic constipation 03/05/2021   Daytime sleepiness 12/27/2020   Caregiver stress 01/05/2020   Class 2 obesity due to excess calories without serious comorbidity with body mass index (BMI) of 36.0 to 36.9 in adult 05/29/2019   IBS (irritable bowel syndrome)    Essential hypertension 05/09/2015   Lack of energy 05/09/2015   Impacted cerumen 10/15/2014   Obstructive sleep apnea syndrome 04/26/2014    Allergies: No Known Allergies Medications:  Current Outpatient Medications:    benzonatate (TESSALON) 100 MG capsule, Take 1 capsule (100 mg total) by mouth 3 (three) times daily as needed for cough., Disp: 30 capsule, Rfl: 0  nirmatrelvir/ritonavir (PAXLOVID) 20 x 150 MG & 10 x 100MG  TABS, Take 3 tablets by mouth 2 (two) times daily for 5 days. (Take nirmatrelvir 150 mg two tablets twice daily for 5 days and ritonavir 100 mg one tablet twice daily for 5 days) Patient GFR is 89, Disp: 30 tablet, Rfl: 0   amLODipine (NORVASC) 5 MG tablet, Take 1 tablet by mouth daily, Disp: 90 tablet, Rfl: 2    Armodafinil 150 MG tablet, Take 1 tablet by mouth daily at 9am, Disp: 30 tablet, Rfl: 3   LINZESS 145 MCG CAPS capsule, TAKE 1 CAPSULE(145 MCG) BY MOUTH DAILY BEFORE BREAKFAST, Disp: 30 capsule, Rfl: 5   Multiple Vitamins-Minerals (MULTIVITAMIN ADULT PO), Vitamin-Mineral Supplement oral liquid, Disp: , Rfl:   Observations/Objective: Patient is well-developed, well-nourished in no acute distress.  Resting comfortably at home.  Head is normocephalic, atraumatic.  No labored breathing. Speech is clear and coherent with logical content.  Patient is alert and oriented at baseline.  Assessment and Plan: 1. COVID-19 - benzonatate (TESSALON) 100 MG capsule; Take 1 capsule (100 mg total) by mouth 3 (three) times daily as needed for cough.  Dispense: 30 capsule; Refill: 0 - nirmatrelvir/ritonavir (PAXLOVID) 20 x 150 MG & 10 x 100MG  TABS; Take 3 tablets by mouth 2 (two) times daily for 5 days. (Take nirmatrelvir 150 mg two tablets twice daily for 5 days and ritonavir 100 mg one tablet twice daily for 5 days) Patient GFR is 89  Dispense: 30 tablet; Refill: 0  Discussed risks/benefits of antiviral medications including most common potential ADRs. Patient voiced understanding and would like to proceed with antiviral medication. They are candidate for Paxlovid. Rx sent to pharmacy. Supportive measures, OTC medications and vitamin regimen reviewed. Tessalon per orders. Quarantine reviewed in detail. Strict ER precautions discussed with patient.    Follow Up Instructions: I discussed the assessment and treatment plan with the patient. The patient was provided an opportunity to ask questions and all were answered. The patient agreed with the plan and demonstrated an understanding of the instructions.  A copy of instructions were sent to the patient via MyChart unless otherwise noted below.   The patient was advised to call back or seek an in-person evaluation if the symptoms worsen or if the condition fails to  improve as anticipated.  Time:  I spent 10 minutes with the patient via telehealth technology discussing the above problems/concerns.    Piedad Climes, PA-C

## 2023-03-12 ENCOUNTER — Other Ambulatory Visit (INDEPENDENT_AMBULATORY_CARE_PROVIDER_SITE_OTHER): Payer: Federal, State, Local not specified - PPO

## 2023-03-12 DIAGNOSIS — R232 Flushing: Secondary | ICD-10-CM

## 2023-03-13 DIAGNOSIS — K080 Exfoliation of teeth due to systemic causes: Secondary | ICD-10-CM | POA: Diagnosis not present

## 2023-03-17 LAB — FOLLICLE STIMULATING HORMONE: FSH: 8 m[IU]/mL

## 2023-03-17 LAB — ESTROGENS, TOTAL: Estrogen: 153 pg/mL

## 2023-04-19 ENCOUNTER — Other Ambulatory Visit: Payer: Self-pay | Admitting: Primary Care

## 2023-04-19 ENCOUNTER — Other Ambulatory Visit: Payer: Self-pay | Admitting: Nurse Practitioner

## 2023-04-19 DIAGNOSIS — K5900 Constipation, unspecified: Secondary | ICD-10-CM

## 2023-06-07 ENCOUNTER — Telehealth: Payer: Self-pay | Admitting: Nurse Practitioner

## 2023-06-07 ENCOUNTER — Other Ambulatory Visit (HOSPITAL_COMMUNITY)
Admission: RE | Admit: 2023-06-07 | Discharge: 2023-06-07 | Disposition: A | Discharge status: Home / Self Care | Payer: Federal, State, Local not specified - PPO | Source: Ambulatory Visit | Attending: Nurse Practitioner | Admitting: Nurse Practitioner

## 2023-06-07 ENCOUNTER — Ambulatory Visit (INDEPENDENT_AMBULATORY_CARE_PROVIDER_SITE_OTHER): Payer: Federal, State, Local not specified - PPO | Admitting: Nurse Practitioner

## 2023-06-07 ENCOUNTER — Encounter: Payer: Self-pay | Admitting: Nurse Practitioner

## 2023-06-07 VITALS — BP 140/100 | HR 78 | Temp 98.0°F | Ht 65.5 in | Wt 210.8 lb

## 2023-06-07 DIAGNOSIS — Z113 Encounter for screening for infections with a predominantly sexual mode of transmission: Secondary | ICD-10-CM

## 2023-06-07 DIAGNOSIS — G4733 Obstructive sleep apnea (adult) (pediatric): Secondary | ICD-10-CM | POA: Diagnosis not present

## 2023-06-07 DIAGNOSIS — Z0000 Encounter for general adult medical examination without abnormal findings: Secondary | ICD-10-CM | POA: Diagnosis not present

## 2023-06-07 DIAGNOSIS — E669 Obesity, unspecified: Secondary | ICD-10-CM

## 2023-06-07 DIAGNOSIS — E781 Pure hyperglyceridemia: Secondary | ICD-10-CM | POA: Insufficient documentation

## 2023-06-07 DIAGNOSIS — Z23 Encounter for immunization: Secondary | ICD-10-CM | POA: Diagnosis not present

## 2023-06-07 DIAGNOSIS — K581 Irritable bowel syndrome with constipation: Secondary | ICD-10-CM

## 2023-06-07 DIAGNOSIS — E041 Nontoxic single thyroid nodule: Secondary | ICD-10-CM

## 2023-06-07 DIAGNOSIS — I10 Essential (primary) hypertension: Secondary | ICD-10-CM | POA: Diagnosis not present

## 2023-06-07 MED ORDER — WEGOVY 0.25 MG/0.5ML ~~LOC~~ SOAJ: 0.2500 mg | SUBCUTANEOUS | 0 refills | Status: DC

## 2023-06-07 NOTE — Assessment & Plan Note (Signed)
History of the same.  Patient currently managed on Linzess 145 mcg daily with a BM every day.  Continue medication as prescribed

## 2023-06-07 NOTE — Patient Instructions (Addendum)
Nice to see you today We did update your flu vaccine today Call and get the ultrasound of your thyroid scheduled Follow up with me in 1 year, sooner if you need me  Zepbound and Wegovy are the 2 injectable weight loss medications on the market.   If top number is 140 or above at home or the bottom number is 90 or higher consistently let me know

## 2023-06-07 NOTE — Telephone Encounter (Signed)
Pt wanted to let Diane Townsend know her pharmacy told her Reginal Lutes is covered by her insurance. Preferred pharmacy is [id number] [place], [place] - [address] AT [address]EC OF [address] Call back # [phone].

## 2023-06-07 NOTE — Assessment & Plan Note (Signed)
History of the same does have a CPAP and followed by pulmonology.  Continue

## 2023-06-07 NOTE — Telephone Encounter (Signed)
Contacted pt in regards to [facility] prescription sent in.  Pt stated that the medication will need PA. Patient stated to contact [employer] [phone])

## 2023-06-07 NOTE — Assessment & Plan Note (Signed)
Discussed age-appropriate immunizations and screening exams.  Did review patient's personal, surgical, social, family histories.  Patient is up-to-date on all age-appropriate vaccinations she would like.  Administer flu vaccine today.  Patient is up-to-date on CRC screening.  Patient up-to-date on breast cancer screening.  Patient along per dissipates in cervical cancer screening as she is status post hysterectomy.  Patient was given information at discharge about preventative healthcare maintenance with anticipatory guidance.

## 2023-06-07 NOTE — Assessment & Plan Note (Signed)
Pending TSH, A1c, lipid panel.  Patient was interested in weight loss medication she had been on phentermine in the past.  Given blood pressure issues defer for now we will see if Diane Townsend is covered.  Patient does have several comorbidities inclusive of hypertension and OSA

## 2023-06-07 NOTE — Assessment & Plan Note (Signed)
History of the same with FNA.  Patient overdue for ultrasound of thyroid order placed today.  Patient given information at discharge to call and get set up

## 2023-06-07 NOTE — Progress Notes (Signed)
Established Patient Office Visit  Subjective   Patient ID: Diane Townsend, female    DOB: Dec 21, 1975  Age: 47 y.o. MRN: [id number]  Chief Complaint  Patient presents with   Annual Exam    Would like flu shot    HPI HTN: states that she will chek her blood pressure twice a week. She will get 130s and the bottom up to 95. 85-95 on the bottom range  OSA: CPAP and followed by pulmonlogy   IBS: states that she is on the linzess daily and will have a BM daily  for complete physical and follow up of chronic conditions.  Immunizations: -Tetanus: Completed in 2019 -Influenza: update today  -Shingles: Too young -Pneumonia: Too young  Diet: Fair diet. She will eat 2 meals a day. Most of the time it is one. States that she will snacks (chips). States that she is jucing. Juice water.  Exercise: Walking 30 mins at 5 times a week.   Eye exam: PRN Dental exam: Completes semi-annually    Colonoscopy: Completed in 2023, repeat 2030 Lung Cancer Screening: N/A  Pap smear: Status post partial hysterectomy  Mammogram: UTD  Dexa: Too young  Sleep: going to bed around 11 and will get up around 650. Feel rested if she goes to sleep. Does snore if she does not have CPAP        Review of Systems  Constitutional:  Negative for chills and fever.  Respiratory:  Negative for shortness of breath.   Cardiovascular:  Negative for chest pain and leg swelling.  Gastrointestinal:  Negative for abdominal pain, blood in stool, constipation, diarrhea, nausea and vomiting.       BM daily   Genitourinary:  Negative for dysuria and hematuria.  Neurological:  Negative for tingling and headaches.  Psychiatric/Behavioral:  Negative for hallucinations and suicidal ideas.       Objective:     BP (!) 140/100   Pulse 78   Temp 98 F (36.7 C) (Oral)   Ht 5' 5.5" (1.664 m)   Wt 210 lb 12.8 oz (95.6 kg)   SpO2 98%   BMI 34.55 kg/m  BP Readings from Last 3 Encounters:  06/07/23 (!) 140/100   12/21/22 (!) 140/90  07/12/22 (!) 142/100   Wt Readings from Last 3 Encounters:  06/07/23 210 lb 12.8 oz (95.6 kg)  12/21/22 205 lb 6.4 oz (93.2 kg)  06/03/22 208 lb (94.3 kg)   SpO2 Readings from Last 3 Encounters:  06/07/23 98%  12/21/22 100%  07/12/22 96%      Physical Exam Vitals and nursing note reviewed.  Constitutional:      Appearance: Normal appearance.  HENT:     Right Ear: Tympanic membrane, ear canal and external ear normal.     Left Ear: Tympanic membrane, ear canal and external ear normal.     Mouth/Throat:     Mouth: Mucous membranes are moist.     Pharynx: Oropharynx is clear.  Eyes:     Extraocular Movements: Extraocular movements intact.     Pupils: Pupils are equal, round, and reactive to light.  Cardiovascular:     Rate and Rhythm: Normal rate and regular rhythm.     Pulses: Normal pulses.     Heart sounds: Normal heart sounds.  Pulmonary:     Effort: Pulmonary effort is normal.     Breath sounds: Normal breath sounds.  Abdominal:     General: Bowel sounds are normal. There is no distension.  Palpations: There is no mass.     Tenderness: There is no abdominal tenderness.     Hernia: No hernia is present.  Musculoskeletal:     Right lower leg: No edema.     Left lower leg: No edema.  Lymphadenopathy:     Cervical: No cervical adenopathy.  Skin:    General: Skin is warm.  Neurological:     General: No focal deficit present.     Mental Status: She is alert.     Deep Tendon Reflexes:     Reflex Scores:      Bicep reflexes are 2+ on the right side and 2+ on the left side.      Patellar reflexes are 2+ on the right side and 2+ on the left side.    Comments: Bilateral upper and lower extremity strength 5/5  Psychiatric:        Mood and Affect: Mood normal.        Behavior: Behavior normal.        Thought Content: Thought content normal.        Judgment: Judgment normal.      No results found for any visits on 06/07/23.    The  10-year ASCVD risk score (Arnett DK, et al., 2019) is: 4.8%    Assessment & Plan:   Problem List Items Addressed This Visit       Cardiovascular and Mediastinum   Essential hypertension    Patient currently maintained on amlodipine 5 mg.  Blood pressure above goal today.  She is checking at home close blood pressure is within goal follow-up 3 months May need to consider increasing amlodipine      Relevant Orders   Hemoglobin A1c   Lipid panel     Respiratory   Obstructive sleep apnea syndrome    History of the same does have a CPAP and followed by pulmonology.  Continue        Digestive   IBS (irritable bowel syndrome)    History of the same.  Patient currently managed on Linzess 145 mcg daily with a BM every day.  Continue medication as prescribed        Endocrine   Thyroid nodule    History of the same with FNA.  Patient overdue for ultrasound of thyroid order placed today.  Patient given information at discharge to call and get set up      Relevant Orders   US THYROID     Other   Preventative health care - Primary    Discussed age-appropriate immunizations and screening exams.  Did review patient's personal, surgical, social, family histories.  Patient is up-to-date on all age-appropriate vaccinations she would like.  Administer flu vaccine today.  Patient is up-to-date on CRC screening.  Patient up-to-date on breast cancer screening.  Patient along per dissipates in cervical cancer screening as she is status post hysterectomy.  Patient was given information at discharge about preventative healthcare maintenance with anticipatory guidance.      Relevant Orders   CBC   Comprehensive metabolic panel   TSH   Hypertriglyceridemia    History of the same.  Pending lipid panel today continue working on lifestyle modifications      Relevant Orders   Hemoglobin A1c   Lipid panel   Obesity (BMI 30-39.9)    Pending TSH, A1c, lipid panel.  Patient was interested in weight  loss medication she had been on phentermine in the past.  Given blood pressure issues defer for  now we will see if Foy Guadalajara is covered.  Patient does have several comorbidities inclusive of hypertension and OSA      Relevant Orders   Hemoglobin A1c   Lipid panel   Other Visit Diagnoses     Screening examination for STI       Relevant Orders   HSV(herpes simplex vrs) 1+2 ab-IgG   HIV Antibody (routine testing w rflx)   RPR   Urine cytology ancillary only   Need for influenza vaccination       Relevant Orders   Flu vaccine trivalent PF, 6mos and older(Flulaval,Afluria,Fluarix,Fluzone) (Completed)       Return in about 3 months (around 09/07/2023) for BP recheck.    Audria Nine, NP

## 2023-06-07 NOTE — Assessment & Plan Note (Signed)
Patient currently maintained on amlodipine 5 mg.  Blood pressure above goal today.  She is checking at home close blood pressure is within goal follow-up 3 months May need to consider increasing amlodipine

## 2023-06-07 NOTE — Telephone Encounter (Signed)
Rx sent in

## 2023-06-07 NOTE — Assessment & Plan Note (Signed)
History of the same.  Pending lipid panel today continue working on lifestyle modifications

## 2023-06-08 ENCOUNTER — Other Ambulatory Visit (HOSPITAL_COMMUNITY): Payer: Self-pay

## 2023-06-08 ENCOUNTER — Telehealth: Payer: Self-pay

## 2023-06-08 LAB — CBC
HCT: 42.5 % (ref 36.0–46.0)
Hemoglobin: 14.1 g/dL (ref 12.0–15.0)
MCHC: 33.1 g/dL (ref 30.0–36.0)
MCV: 82.4 fL (ref 78.0–100.0)
Platelets: 266 10*3/uL (ref 150.0–400.0)
RBC: 5.15 Mil/uL — ABNORMAL HIGH (ref 3.87–5.11)
RDW: 13.5 % (ref 11.5–15.5)
WBC: 4.5 10*3/uL (ref 4.0–10.5)

## 2023-06-08 LAB — COMPREHENSIVE METABOLIC PANEL
ALT: 42 U/L — ABNORMAL HIGH (ref 0–35)
AST: 38 U/L — ABNORMAL HIGH (ref 0–37)
Albumin: 4.7 g/dL (ref 3.5–5.2)
Alkaline Phosphatase: 46 U/L (ref 39–117)
BUN: 12 mg/dL (ref 6–23)
CO2: 29 meq/L (ref 19–32)
Calcium: 10 mg/dL (ref 8.4–10.5)
Chloride: 103 meq/L (ref 96–112)
Creatinine, Ser: 0.88 mg/dL (ref 0.40–1.20)
GFR: 77.74 mL/min (ref >60.00)
Glucose, Bld: 97 mg/dL (ref 70–99)
Potassium: 3.7 meq/L (ref 3.5–5.1)
Sodium: 140 meq/L (ref 135–145)
Total Bilirubin: 0.6 mg/dL (ref 0.2–1.2)
Total Protein: 7.8 g/dL (ref 6.0–8.3)

## 2023-06-08 LAB — TSH: TSH: 1.62 u[IU]/mL (ref 0.35–5.50)

## 2023-06-08 LAB — LIPID PANEL
Cholesterol: 180 mg/dL (ref 0–200)
HDL: 47.6 mg/dL (ref >39.00)
LDL Cholesterol: 85 mg/dL (ref 0–99)
NonHDL: 132.59
Total CHOL/HDL Ratio: 4
Triglycerides: 239 mg/dL — ABNORMAL HIGH (ref 0.0–149.0)
VLDL: 47.8 mg/dL — ABNORMAL HIGH (ref 0.0–40.0)

## 2023-06-08 LAB — HEMOGLOBIN A1C: Hgb A1c MFr Bld: 5.9 % (ref 4.6–6.5)

## 2023-06-08 NOTE — Telephone Encounter (Signed)
Spoke with Dorise Hiss. From [employer] program to initiate a prior authorization for [facility].  Lanora Manis stated that the process has to be reviewed by pharmacist.  States that this will take up to 72 hours for a decision.

## 2023-06-08 NOTE — Telephone Encounter (Signed)
PA request has been Submitted. New Encounter created for follow up. For additional info see Pharmacy Prior Auth telephone encounter from 06/08/23.

## 2023-06-08 NOTE — Telephone Encounter (Signed)
Pharmacy Patient Advocate Encounter   Received notification from Pt Calls Messages that prior authorization for Wegovy 0.25MG /0.5ML is required/requested.   Insurance verification completed.   The patient is insured through [facility] .   Per test claim: PA required; PA submitted to above mentioned insurance via CoverMyMeds Key/confirmation #/EOC [id number] Status is pending

## 2023-06-09 LAB — URINE CYTOLOGY ANCILLARY ONLY
Chlamydia: NEGATIVE
Comment: NEGATIVE
Comment: NEGATIVE
Comment: NORMAL
Neisseria Gonorrhea: NEGATIVE
Trichomonas: NEGATIVE

## 2023-06-09 LAB — HSV(HERPES SIMPLEX VRS) I + II AB-IGG
HSV 1 IGG,TYPE SPECIFIC AB: 30.9 {index} — ABNORMAL HIGH
HSV 2 IGG,TYPE SPECIFIC AB: 17.5 {index} — ABNORMAL HIGH

## 2023-06-09 LAB — RPR: RPR Ser Ql: NONREACTIVE

## 2023-06-09 LAB — HIV ANTIBODY (ROUTINE TESTING W REFLEX): HIV 1&2 Ab, 4th Generation: NONREACTIVE

## 2023-06-10 ENCOUNTER — Telehealth: Payer: Self-pay | Admitting: Nurse Practitioner

## 2023-06-10 ENCOUNTER — Other Ambulatory Visit (HOSPITAL_COMMUNITY): Payer: Self-pay

## 2023-06-10 NOTE — Telephone Encounter (Signed)
Pharmacy Patient Advocate Encounter  Received notification from [facility] that Prior Authorization for [facility] has been APPROVED from 05/10/23 to 12/06/23. Ran test claim, Copay is $24.99. This test claim was processed through [facility]- copay amounts may vary at other pharmacies due to pharmacy/plan contracts, or as the patient moves through the different stages of their insurance plan.   PA #/Case ID/Reference #:  [id number]

## 2023-06-10 NOTE — Telephone Encounter (Signed)
Contacted pt regarding lab results. 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 Advised to pt per PCP that no treatment is needed as long as pt has no lesions or outbreaks. Pt understood and verbalized understanding.

## 2023-06-10 NOTE — Telephone Encounter (Signed)
Spoke with patient and notified patient of approval.

## 2023-06-10 NOTE — Telephone Encounter (Signed)
Patient is requesting a phone call regarding something that was put in her mychart.She did not go into detail as to what it was,she would just like a phone call to discuss.

## 2023-06-21 ENCOUNTER — Ambulatory Visit: Payer: Federal, State, Local not specified - PPO

## 2023-06-25 ENCOUNTER — Telehealth: Payer: Federal, State, Local not specified - PPO | Admitting: Family Medicine

## 2023-06-25 DIAGNOSIS — J111 Influenza due to unidentified influenza virus with other respiratory manifestations: Secondary | ICD-10-CM

## 2023-06-25 DIAGNOSIS — R062 Wheezing: Secondary | ICD-10-CM | POA: Diagnosis not present

## 2023-06-25 MED ORDER — PREDNISONE 20 MG PO TABS: 20.0000 mg | ORAL_TABLET | Freq: Two times a day (BID) | ORAL | 0 refills | Status: AC

## 2023-06-25 MED ORDER — OSELTAMIVIR PHOSPHATE 75 MG PO CAPS: 75.0000 mg | ORAL_CAPSULE | Freq: Two times a day (BID) | ORAL | 0 refills | Status: AC

## 2023-06-25 MED ORDER — PROMETHAZINE-DM 6.25-15 MG/5ML PO SYRP: 5.0000 mL | ORAL_SOLUTION | Freq: Four times a day (QID) | ORAL | 0 refills | Status: AC | PRN

## 2023-06-25 NOTE — Progress Notes (Signed)
Virtual Visit Consent   Diane Townsend, you are scheduled for a virtual visit with a [facility] provider today. Just as with appointments in the office, your consent must be obtained to participate. Your consent will be active for this visit and any virtual visit you may have with one of our providers in the next 365 days. If you have a MyChart account, a copy of this consent can be sent to you electronically.  As this is a virtual visit, video technology does not allow for your provider to perform a traditional examination. This may limit your provider's ability to fully assess your condition. If your provider identifies any concerns that need to be evaluated in person or the need to arrange testing (such as labs, EKG, etc.), we will make arrangements to do so. Although advances in technology are sophisticated, we cannot ensure that it will always work on either your end or our end. If the connection with a video visit is poor, the visit may have to be switched to a telephone visit. With either a video or telephone visit, we are not always able to ensure that we have a secure connection.  By engaging in this virtual visit, you consent to the provision of healthcare and authorize for your insurance to be billed (if applicable) for the services provided during this visit. Depending on your insurance coverage, you may receive a charge related to this service.  I need to obtain your verbal consent now. Are you willing to proceed with your visit today? Diane Townsend has provided verbal consent on 06/25/2023 for a virtual visit (video or telephone). Georgana Curio, FNP  Date: 06/25/2023 10:34 AM  Virtual Visit via Video Note   I, Georgana Curio, connected with  Diane Townsend  ([id number], 12-26-1975) on 06/25/23 at 10:30 AM EST by a video-enabled telemedicine application and verified that I am speaking with the correct person using two identifiers.  Location: Patient: Virtual Visit Location Patient:  Home Provider: Virtual Visit Location Provider: Home Office   I discussed the limitations of evaluation and management by telemedicine and the availability of in person appointments. The patient expressed understanding and agreed to proceed.    History of Present Illness: Diane Townsend is a 47 y.o. who identifies as a female who was assigned female at birth, and is being seen today for exposure to influenza with sx for 4-5 days. She has fever, cough, wheezing, with cough worse at night. Marland Kitchen  HPI: HPI  Problems:  Patient Active Problem List   Diagnosis Date Noted   Hypertriglyceridemia 06/07/2023   Obesity (BMI 30-39.9) 06/07/2023   Upper respiratory tract infection 06/03/2022   Thyroid nodule 06/03/2022   Sleep apnea with hypersomnolence 12/29/2021   Preventative health care 06/02/2021   Chronic idiopathic constipation 03/05/2021   Daytime sleepiness 12/27/2020   Caregiver stress 01/05/2020   Class 2 obesity due to excess calories without serious comorbidity with body mass index (BMI) of 36.0 to 36.9 in adult 05/29/2019   IBS (irritable bowel syndrome)    Essential hypertension 05/09/2015   Lack of energy 05/09/2015   Impacted cerumen 10/15/2014   Obstructive sleep apnea syndrome 04/26/2014    Allergies: No Known Allergies Medications:  Current Outpatient Medications:    oseltamivir (TAMIFLU) 75 MG capsule, Take 1 capsule (75 mg total) by mouth 2 (two) times daily for 5 days., Disp: 10 capsule, Rfl: 0   predniSONE (DELTASONE) 20 MG tablet, Take 1 tablet (20 mg total) by mouth 2 (two) times daily with  a meal for 5 days., Disp: 10 tablet, Rfl: 0   promethazine-dextromethorphan (PROMETHAZINE-DM) 6.25-15 MG/5ML syrup, Take 5 mLs by mouth 4 (four) times daily as needed for up to 10 days for cough., Disp: 118 mL, Rfl: 0   amLODipine (NORVASC) 5 MG tablet, Take 1 tablet by mouth daily, Disp: 90 tablet, Rfl: 2   Armodafinil 150 MG tablet, TAKE 1 TABLET BY MOUTH DAILY AT 9 AM, Disp: 30  tablet, Rfl: 2   benzonatate (TESSALON) 100 MG capsule, Take 1 capsule (100 mg total) by mouth 3 (three) times daily as needed for cough. (Patient not taking: Reported on 06/07/2023), Disp: 30 capsule, Rfl: 0   LINZESS 145 MCG CAPS capsule, TAKE 1 CAPSULE(145 MCG) BY MOUTH DAILY BEFORE BREAKFAST, Disp: 30 capsule, Rfl: 5   Multiple Vitamins-Minerals (MULTIVITAMIN ADULT PO), Vitamin-Mineral Supplement oral liquid, Disp: , Rfl:    Semaglutide-Weight Management (WEGOVY) 0.25 MG/0.5ML SOAJ, Inject 0.25 mg into the skin once a week., Disp: 2 mL, Rfl: 0  Observations/Objective: Patient is well-developed, well-nourished in no acute distress.  Resting comfortably  at home.  Head is normocephalic, atraumatic.  No labored breathing.  Speech is clear and coherent with logical content.  Patient is alert and oriented at baseline.    Assessment and Plan: 1. Influenza  2. Wheezing  Increase fluids, humidifier at night, tylenol or ibuprofen as directed, uc if sx persist or worsen.   Follow Up Instructions: I discussed the assessment and treatment plan with the patient. The patient was provided an opportunity to ask questions and all were answered. The patient agreed with the plan and demonstrated an understanding of the instructions.  A copy of instructions were sent to the patient via MyChart unless otherwise noted below.     The patient was advised to call back or seek an in-person evaluation if the symptoms worsen or if the condition fails to improve as anticipated.    Georgana Curio, FNP

## 2023-06-25 NOTE — Patient Instructions (Signed)

## 2023-06-29 ENCOUNTER — Ambulatory Visit
Admission: RE | Admit: 2023-06-29 | Discharge: 2023-06-29 | Disposition: A | Discharge status: Home / Self Care | Payer: Federal, State, Local not specified - PPO | Source: Ambulatory Visit | Attending: Nurse Practitioner | Admitting: Nurse Practitioner

## 2023-06-29 DIAGNOSIS — E041 Nontoxic single thyroid nodule: Secondary | ICD-10-CM | POA: Insufficient documentation

## 2023-06-30 DIAGNOSIS — G4733 Obstructive sleep apnea (adult) (pediatric): Secondary | ICD-10-CM | POA: Diagnosis not present

## 2023-07-02 ENCOUNTER — Other Ambulatory Visit: Payer: Self-pay | Admitting: Nurse Practitioner

## 2023-07-02 DIAGNOSIS — E669 Obesity, unspecified: Secondary | ICD-10-CM

## 2023-07-05 MED ORDER — WEGOVY 0.5 MG/0.5ML ~~LOC~~ SOAJ: 0.5000 mg | SUBCUTANEOUS | 0 refills | Status: DC

## 2023-07-05 NOTE — Telephone Encounter (Signed)
Patient called in to check on the status of this medication being sent into the pharmacy for her.

## 2023-07-31 DIAGNOSIS — G4733 Obstructive sleep apnea (adult) (pediatric): Secondary | ICD-10-CM | POA: Diagnosis not present

## 2023-08-03 ENCOUNTER — Other Ambulatory Visit: Payer: Self-pay | Admitting: Nurse Practitioner

## 2023-08-03 DIAGNOSIS — E669 Obesity, unspecified: Secondary | ICD-10-CM

## 2023-08-04 MED ORDER — WEGOVY 1 MG/0.5ML ~~LOC~~ SOAJ: 1.0000 mg | SUBCUTANEOUS | 0 refills | Status: DC

## 2023-08-06 ENCOUNTER — Telehealth: Payer: Self-pay | Admitting: Nurse Practitioner

## 2023-08-06 NOTE — Telephone Encounter (Signed)
Patient dropped off document Insurance Form, to be filled out by provider. Patient requested to send it back via Call Patient to pick up within 5-days. Document is located in providers tray at front office.Please advise at Mobile [phone] (mobile)

## 2023-08-07 ENCOUNTER — Encounter: Payer: Self-pay | Admitting: Nurse Practitioner

## 2023-08-09 NOTE — Telephone Encounter (Signed)
PCP has been made aware of the document Insurance form. Will contact pt once form has been completed.

## 2023-08-10 NOTE — Telephone Encounter (Signed)
Contacted pt to make her aware of the form that has been completed and  faxed to the number provided. Pt will pick up copy from the front office. She has no further questions or concerns.

## 2023-08-13 ENCOUNTER — Other Ambulatory Visit: Payer: Self-pay | Admitting: Nurse Practitioner

## 2023-08-17 MED ORDER — AMLODIPINE BESYLATE 5 MG PO TABS: 5.0000 mg | ORAL_TABLET | Freq: Every day | ORAL | 0 refills | Status: DC

## 2023-08-31 DIAGNOSIS — G4733 Obstructive sleep apnea (adult) (pediatric): Secondary | ICD-10-CM | POA: Diagnosis not present

## 2023-09-08 ENCOUNTER — Ambulatory Visit: Payer: Federal, State, Local not specified - PPO | Admitting: Nurse Practitioner

## 2023-09-16 ENCOUNTER — Ambulatory Visit: Payer: Federal, State, Local not specified - PPO | Admitting: Nurse Practitioner

## 2023-09-16 VITALS — BP 140/98 | HR 106 | Temp 98.1°F | Ht 65.5 in | Wt 205.8 lb

## 2023-09-16 DIAGNOSIS — I10 Essential (primary) hypertension: Secondary | ICD-10-CM

## 2023-09-16 DIAGNOSIS — E669 Obesity, unspecified: Secondary | ICD-10-CM

## 2023-09-16 MED ORDER — WEGOVY 1 MG/0.5ML ~~LOC~~ SOAJ: 1.0000 mg | SUBCUTANEOUS | 0 refills | Status: DC

## 2023-09-16 MED ORDER — AMLODIPINE BESYLATE 10 MG PO TABS: 10.0000 mg | ORAL_TABLET | Freq: Every day | ORAL | 1 refills | Status: DC

## 2023-09-16 NOTE — Assessment & Plan Note (Signed)
 Patient currently maintained on amlodipine 5 mg daily.  She is checking blood pressure at home.  Patient still not within goal we will increase amlodipine to 10 mg daily.  Continue checking blood pressure at home 2-3 times a week.

## 2023-09-16 NOTE — Progress Notes (Signed)
 Established Patient Office Visit  Subjective   Patient ID: Diane Townsend, female    DOB: 1975/11/02  Age: 48 y.o. MRN: [id number]  Chief Complaint  Patient presents with   Follow-up    BP recheck.     HPI  HTN: patient is currently maintained on amlodipine 5mg  daily. She is checking her blood pressure at home and been getting readings of 144/91, 139/86 145/91, 159/98 on jan 1 that evening 132/91/. States that she is checking it daily. She is tolerating the medication well   Obesity: patient had asked for a tier expectation for wegovy. She states it was denied because her insurance mentioned she has not tried phentermine. Patient is not a candidate for phentermine due to uncontrolled blood pressure and being on antihypertensives      Review of Systems  Constitutional:  Negative for chills and fever.  Respiratory:  Negative for shortness of breath.   Cardiovascular:  Negative for chest pain and leg swelling.  Neurological:  Negative for headaches.  Psychiatric/Behavioral:  Negative for hallucinations and suicidal ideas.       Objective:     BP (!) 140/98   Pulse (!) 106   Temp 98.1 F (36.7 C) (Oral)   Ht 5' 5.5" (1.664 m)   Wt 205 lb 12.8 oz (93.4 kg)   SpO2 94%   BMI 33.73 kg/m  BP Readings from Last 3 Encounters:  09/16/23 (!) 140/98  06/07/23 (!) 140/100  12/21/22 (!) 140/90   Wt Readings from Last 3 Encounters:  09/16/23 205 lb 12.8 oz (93.4 kg)  06/07/23 210 lb 12.8 oz (95.6 kg)  12/21/22 205 lb 6.4 oz (93.2 kg)   SpO2 Readings from Last 3 Encounters:  09/16/23 94%  06/07/23 98%  12/21/22 100%      Physical Exam Vitals and nursing note reviewed.  Constitutional:      Appearance: Normal appearance.  Cardiovascular:     Rate and Rhythm: Normal rate and regular rhythm.     Heart sounds: Normal heart sounds.  Pulmonary:     Effort: Pulmonary effort is normal.     Breath sounds: Normal breath sounds.  Musculoskeletal:     Right lower leg: No  edema.     Left lower leg: No edema.  Neurological:     Mental Status: She is alert.      No results found for any visits on 09/16/23.    The 10-year ASCVD risk score (Arnett DK, et al., 2019) is: 4.9%    Assessment & Plan:   Problem List Items Addressed This Visit       Cardiovascular and Mediastinum   Essential hypertension - Primary   Patient currently maintained on amlodipine 5 mg daily.  She is checking blood pressure at home.  Patient still not within goal we will increase amlodipine to 10 mg daily.  Continue checking blood pressure at home 2-3 times a week.      Relevant Medications   amLODipine (NORVASC) 10 MG tablet     Other   Obesity (BMI 30-39.9)   Patient was on Wegovy.  We did do a tier exception which was declined per patient report patient states that she has not tried and failed phentermine.  Patient is not a candidate for phentermine due to uncontrolled blood pressure on antihypertensives.  Refill provided for Wegovy 1 mg once weekly.  Patient given a attestation that she can send into her insurance      Relevant Medications   Semaglutide-Weight Management (  WEGOVY) 1 MG/0.5ML SOAJ    Return in about 3 months (around 12/14/2023) for BP recheck/ wegovy .    Audria Nine, NP

## 2023-09-16 NOTE — Assessment & Plan Note (Signed)
 Patient was on Wegovy.  We did do a tier exception which was declined per patient report patient states that she has not tried and failed phentermine.  Patient is not a candidate for phentermine due to uncontrolled blood pressure on antihypertensives.  Refill provided for Wegovy 1 mg once weekly.  Patient given a attestation that she can send into her insurance

## 2023-09-16 NOTE — Patient Instructions (Signed)
 Nice to see you today I have increased the amlodipine to 10mg  daily Check your blood pressure 2-3 times a week at home Follow up with me in 3 months, sooner if you need me

## 2023-10-12 ENCOUNTER — Other Ambulatory Visit: Payer: Self-pay | Admitting: Nurse Practitioner

## 2023-10-12 DIAGNOSIS — Z1231 Encounter for screening mammogram for malignant neoplasm of breast: Secondary | ICD-10-CM

## 2023-10-30 ENCOUNTER — Other Ambulatory Visit: Payer: Self-pay | Admitting: Nurse Practitioner

## 2023-10-30 DIAGNOSIS — K5900 Constipation, unspecified: Secondary | ICD-10-CM

## 2023-11-23 ENCOUNTER — Other Ambulatory Visit (HOSPITAL_COMMUNITY): Payer: Self-pay

## 2023-11-23 ENCOUNTER — Telehealth: Payer: Self-pay

## 2023-11-23 NOTE — Telephone Encounter (Signed)
 Pharmacy Patient Advocate Encounter   Received notification from Patient Pharmacy that prior authorization for Wegovy  1 is required/requested.   Insurance verification completed.   The patient is insured through [facility] .   Per test claim: PA required; PA submitted to above mentioned insurance via CoverMyMeds Key/confirmation #/EOC [facility] Status is pending

## 2023-11-24 ENCOUNTER — Other Ambulatory Visit (HOSPITAL_COMMUNITY): Payer: Self-pay

## 2023-11-24 NOTE — Telephone Encounter (Signed)
 Pharmacy Patient Advocate Encounter  Received notification from [facility] that Prior Authorization for Wegovy  has been DENIED.  Full denial letter will be uploaded to the media tab. See denial reason below.   PA #/Case ID/Reference #: Janella Median

## 2023-11-26 ENCOUNTER — Ambulatory Visit
Admission: RE | Admit: 2023-11-26 | Discharge: 2023-11-26 | Disposition: A | Discharge status: Home / Self Care | Source: Ambulatory Visit | Attending: Nurse Practitioner | Admitting: Nurse Practitioner

## 2023-11-26 DIAGNOSIS — Z1231 Encounter for screening mammogram for malignant neoplasm of breast: Secondary | ICD-10-CM | POA: Diagnosis not present

## 2023-11-30 ENCOUNTER — Telehealth: Payer: Self-pay | Admitting: Nurse Practitioner

## 2023-11-30 NOTE — Telephone Encounter (Signed)
 Contacted pt and relayed information. Pt verbalized understanding and wrote down information for [facility]. No concerns or questions.

## 2023-11-30 NOTE — Telephone Encounter (Signed)
 Can we let the patient know that they have denied wegovy . She needs to be a part of a comprehensive weight program for 6 months.   I have listed healthy [facility] wellness if she would like to schedule with them  [facility]  Address: [address] [place], [place], [place] [address] Hours:  Closes soon ? 5?PM ? Opens 7?AM Tue Confirmed by this business 7 weeks ago Phone: [phone]

## 2023-12-01 ENCOUNTER — Other Ambulatory Visit: Payer: Self-pay | Admitting: Nurse Practitioner

## 2023-12-01 DIAGNOSIS — R928 Other abnormal and inconclusive findings on diagnostic imaging of breast: Secondary | ICD-10-CM

## 2023-12-06 ENCOUNTER — Ambulatory Visit
Admission: RE | Admit: 2023-12-06 | Discharge: 2023-12-06 | Disposition: A | Discharge status: Home / Self Care | Source: Ambulatory Visit | Attending: Nurse Practitioner | Admitting: Nurse Practitioner

## 2023-12-06 DIAGNOSIS — R928 Other abnormal and inconclusive findings on diagnostic imaging of breast: Secondary | ICD-10-CM

## 2023-12-06 DIAGNOSIS — N6321 Unspecified lump in the left breast, upper outer quadrant: Secondary | ICD-10-CM | POA: Diagnosis not present

## 2023-12-06 DIAGNOSIS — R92322 Mammographic fibroglandular density, left breast: Secondary | ICD-10-CM | POA: Diagnosis not present

## 2023-12-07 ENCOUNTER — Other Ambulatory Visit: Payer: Self-pay | Admitting: Nurse Practitioner

## 2023-12-07 DIAGNOSIS — R928 Other abnormal and inconclusive findings on diagnostic imaging of breast: Secondary | ICD-10-CM

## 2023-12-08 NOTE — Telephone Encounter (Signed)
 Pt made aware of wegovy  denial. See other encounter for 11/30/2023

## 2023-12-10 ENCOUNTER — Ambulatory Visit
Admission: RE | Admit: 2023-12-10 | Discharge: 2023-12-10 | Disposition: A | Discharge status: Home / Self Care | Source: Ambulatory Visit | Attending: Nurse Practitioner | Admitting: Nurse Practitioner

## 2023-12-10 ENCOUNTER — Other Ambulatory Visit: Payer: Self-pay | Admitting: Nurse Practitioner

## 2023-12-10 DIAGNOSIS — R928 Other abnormal and inconclusive findings on diagnostic imaging of breast: Secondary | ICD-10-CM | POA: Diagnosis not present

## 2023-12-10 DIAGNOSIS — N6321 Unspecified lump in the left breast, upper outer quadrant: Secondary | ICD-10-CM | POA: Diagnosis not present

## 2023-12-10 DIAGNOSIS — N6489 Other specified disorders of breast: Secondary | ICD-10-CM | POA: Diagnosis not present

## 2023-12-10 HISTORY — PX: BREAST BIOPSY: SHX20

## 2023-12-10 MED ORDER — CHLOROPROCAINE HCL (PF) 3 % IJ SOLN
10.0000 mL | Freq: Once | INTRAMUSCULAR | Status: AC
  Administered 2023-12-10: 10 mL

## 2023-12-14 LAB — SURGICAL PATHOLOGY

## 2023-12-15 ENCOUNTER — Ambulatory Visit: Payer: Federal, State, Local not specified - PPO | Admitting: Nurse Practitioner

## 2023-12-20 ENCOUNTER — Encounter

## 2023-12-20 ENCOUNTER — Other Ambulatory Visit

## 2023-12-22 ENCOUNTER — Other Ambulatory Visit: Payer: Self-pay | Admitting: Nurse Practitioner

## 2023-12-22 NOTE — Telephone Encounter (Signed)
 Last Fill: 04/20/23  Last OV: 12/21/22 Next OV: 02/09/24  Routing to provider for review/authorization.

## 2023-12-22 NOTE — Telephone Encounter (Signed)
 Copied from CRM [id number]. Topic: Clinical - Medication Refill >> Dec 22, 2023  2:59 PM Juliana Ocean wrote: Medication: Armodafinil  150 MG tablet  Has the patient contacted their pharmacy? Yes Call the office  This is the patient's preferred pharmacy:  [id number] [place], [place] - [address] AT [facility] [address] [place] [address] Phone: [phone] Fax: [phone]   Is this the correct pharmacy for this prescription? Yes If no, delete pharmacy and type the correct one.   Has the prescription been filled recently? No  Is the patient out of the medication? Yes  Has the patient been seen for an appointment in the last year OR does the patient have an upcoming appointment? Yes  Can we respond through MyChart? 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  Pt states she needs this med more than ever now.  Will discuss dose at next appt.

## 2023-12-24 MED ORDER — ARMODAFINIL 150 MG PO TABS: ORAL_TABLET | ORAL | 2 refills | Status: DC

## 2023-12-27 ENCOUNTER — Telehealth: Payer: Self-pay

## 2023-12-27 ENCOUNTER — Other Ambulatory Visit (HOSPITAL_COMMUNITY): Payer: Self-pay

## 2023-12-27 NOTE — Telephone Encounter (Signed)
 It's active, it was renewed last week

## 2023-12-27 NOTE — Telephone Encounter (Signed)
*  Pulm  Pharmacy Patient Advocate Encounter   Received notification from Fax that prior authorization for Armodanifil 150mg  tablets is required/requested.   Insurance verification completed.   The patient is insured through [facility] .   Pending starting process due to prescribers DEA

## 2023-12-28 ENCOUNTER — Other Ambulatory Visit (HOSPITAL_COMMUNITY): Payer: Self-pay

## 2023-12-28 NOTE — Telephone Encounter (Signed)
 Approved today by [facility] NCPDP 2017 Your PA request has been approved. Additional information will be provided in the approval communication. (Message 1145) Effective Date: 11/28/2023 Authorization Expiration Date: 12/27/2024

## 2023-12-29 ENCOUNTER — Ambulatory Visit: Admitting: Nurse Practitioner

## 2023-12-29 VITALS — BP 142/90 | HR 93 | Temp 98.1°F | Ht 65.5 in | Wt 212.8 lb

## 2023-12-29 DIAGNOSIS — E669 Obesity, unspecified: Secondary | ICD-10-CM | POA: Diagnosis not present

## 2023-12-29 DIAGNOSIS — I10 Essential (primary) hypertension: Secondary | ICD-10-CM

## 2023-12-29 NOTE — Assessment & Plan Note (Signed)
 Currently on amlodipine  10mg  daily. Tolerating the medication well. She is working on lifestyle changes. BP on the cusp of normal. Will hold with amlodipine  10 and continue lifestyle changes. Will reach out in 1 month to see what readings have been and review vitals at healthy weight and wellness clinic.

## 2023-12-29 NOTE — Progress Notes (Signed)
 Established Patient Office Visit  Subjective   Patient ID: Diane Townsend, female    DOB: September 05, 1975  Age: 48 y.o. MRN: [id number]  Chief Complaint  Patient presents with   Follow-up    Pt has no concerns of BP. Checks at home with readings that range at 139/89. Takes bp medication every morning.     Medication Management    Wegovy . Pt states insurance won't cover it.      HPI  QMV:HQIONGE was seen by me on 09/16/2023 with elevated blood pressure.  Her amlodipine  was increased from 5 mg to 10 mg daily.  Patient was on Wegovy  in the past but had a tear change and they preferred phentermine.  We did reorder Wegovy  and it was denied She has been checking her blood pressure dialy at home. States that she is back in the office may 5th and the closest one is [place]. States that she has been getting readings of belwo 140 in the past two weeks States that she is getting 130s over 80s sometimes 90s  States that she is walking on her breaks at work. She is speed walking 3 times  a day     Review of Systems  Constitutional:  Negative for chills and fever.  Respiratory:  Negative for shortness of breath.   Cardiovascular:  Negative for chest pain.  Neurological:  Negative for headaches.  Psychiatric/Behavioral:  Negative for hallucinations and suicidal ideas.       Objective:     BP (!) 142/90   Pulse 93   Temp 98.1 F (36.7 C) (Oral)   Ht 5' 5.5 (1.664 m)   Wt 212 lb 12.8 oz (96.5 kg)   SpO2 99%   BMI 34.87 kg/m  BP Readings from Last 3 Encounters:  12/29/23 (!) 142/90  09/16/23 (!) 140/98  06/07/23 (!) 140/100   Wt Readings from Last 3 Encounters:  12/29/23 212 lb 12.8 oz (96.5 kg)  09/16/23 205 lb 12.8 oz (93.4 kg)  06/07/23 210 lb 12.8 oz (95.6 kg)   SpO2 Readings from Last 3 Encounters:  12/29/23 99%  09/16/23 94%  06/07/23 98%      Physical Exam Vitals and nursing note reviewed.  Constitutional:      Appearance: Normal appearance.  Cardiovascular:      Rate and Rhythm: Normal rate and regular rhythm.     Heart sounds: Normal heart sounds.  Pulmonary:     Effort: Pulmonary effort is normal.     Breath sounds: Normal breath sounds.  Neurological:     Mental Status: She is alert.      No results found for any visits on 12/29/23.    The 10-year ASCVD risk score (Arnett DK, et al., 2019) is: 5.2%    Assessment & Plan:   Problem List Items Addressed This Visit       Cardiovascular and Mediastinum   Essential hypertension - Primary   Currently on amlodipine  10mg  daily. Tolerating the medication well. She is working on lifestyle changes. BP on the cusp of normal. Will hold with amlodipine  10 and continue lifestyle changes. Will reach out in 1 month to see what readings have been and review vitals at healthy weight and wellness clinic.        Other   Obesity (BMI 30-39.9)   Has gained weight since last visit. Continue working on lifestyle modifications. She has an appt with heatlhy weight and wellness. Looks like insurance will cover if she is in a comprehensive weight  management program        Return in about 3 months (around 03/30/2024) for BP recheck.    Margarie Shay, NP

## 2023-12-29 NOTE — Patient Instructions (Addendum)
 Nice to see you today Continue checking your blood pressure outside the office I want to see you in 3 months for a blood pressure recheck

## 2023-12-29 NOTE — Assessment & Plan Note (Signed)
 Has gained weight since last visit. Continue working on lifestyle modifications. She has an appt with heatl[facility] weight and wellness. Looks like insurance will cover if she is in a comprehensive weight management program

## 2024-01-26 ENCOUNTER — Telehealth: Payer: Self-pay

## 2024-01-26 NOTE — Telephone Encounter (Signed)
 Pharmacy Patient Advocate Encounter   Received notification from CoverMyMeds that prior authorization for Linzess  capsules is required/requested.   Insurance verification completed.   The patient is insured through [facility] .   Per test claim: PA required; PA submitted to above mentioned insurance via CoverMyMeds Key/confirmation #/EOC [id number] Status is pending

## 2024-01-28 NOTE — Telephone Encounter (Signed)
 Pharmacy Patient Advocate Encounter  Received notification from [facility] that Prior Authorization for Linzess  capsules has been APPROVED from 12/28/23 to 01/26/25   PA #/Case ID/Reference #: [id number]

## 2024-02-02 ENCOUNTER — Ambulatory Visit (INDEPENDENT_AMBULATORY_CARE_PROVIDER_SITE_OTHER): Admitting: Adult Health

## 2024-02-02 ENCOUNTER — Encounter (INDEPENDENT_AMBULATORY_CARE_PROVIDER_SITE_OTHER): Payer: Self-pay | Admitting: Adult Health

## 2024-02-02 VITALS — BP 137/79 | HR 67 | Temp 97.7°F | Ht 64.0 in | Wt 211.0 lb

## 2024-02-02 DIAGNOSIS — I10 Essential (primary) hypertension: Secondary | ICD-10-CM | POA: Diagnosis not present

## 2024-02-02 DIAGNOSIS — G4733 Obstructive sleep apnea (adult) (pediatric): Secondary | ICD-10-CM

## 2024-02-02 DIAGNOSIS — R7303 Prediabetes: Secondary | ICD-10-CM

## 2024-02-02 DIAGNOSIS — Z0289 Encounter for other administrative examinations: Secondary | ICD-10-CM

## 2024-02-02 DIAGNOSIS — K581 Irritable bowel syndrome with constipation: Secondary | ICD-10-CM | POA: Diagnosis not present

## 2024-02-02 DIAGNOSIS — E781 Pure hyperglyceridemia: Secondary | ICD-10-CM

## 2024-02-02 DIAGNOSIS — Z6836 Body mass index (BMI) 36.0-36.9, adult: Secondary | ICD-10-CM

## 2024-02-02 DIAGNOSIS — R7989 Other specified abnormal findings of blood chemistry: Secondary | ICD-10-CM

## 2024-02-02 DIAGNOSIS — E669 Obesity, unspecified: Secondary | ICD-10-CM

## 2024-02-02 NOTE — Progress Notes (Signed)
 Office: [phone]  /  Fax: [phone]   Initial Visit    Diane Townsend was seen in clinic today to evaluate for obesity. She is interested in losing weight to improve overall health and reduce the risk of weight related complications. She presents today to review program treatment options, initial physical assessment, and evaluation.     She was referred by: PCP  When asked what else they would like to accomplish? She states: Adopt a healthier eating pattern and lifestyle, Improve energy levels and physical activity, Improve existing medical conditions, Improve quality of life, and Current Weight 211 lbs, Goal Weight 180 lbs  When asked how has your weight affected you? She states: Contributed to medical problems, Contributed to orthopedic problems or mobility issues, Having fatigue, Having poor endurance, and Problems with eating patterns  Weight history: Lifelong challenge with elevated BMI  Highest weight: 215 lbs  Some associated conditions: Hypertension and Prediabetes  Contributing factors: family history of obesity, disruption of circadian rhythm / sleep disordered breathing, consumption of processed foods, reduced physical activity, chronic skipping of meals, need for convenience due to lack of time, sedentary job, hectic pace of life, and need for convenient foods  Weight promoting medications identified: None  Prior weight loss attempts: None  Current nutrition plan: None  Current level of physical activity: NEAT  Current or previous pharmacotherapy: GLP-1  Response to medication: Was cost prohibitive or lost coverage for AOM   Past medical history includes:   Past Medical History:  Diagnosis Date   Hypertensive disorder 05/09/2015   IBS (irritable bowel syndrome)    Obstructive sleep apnea syndrome 04/26/2014     Objective    BP 137/79   Pulse 67   Temp 97.7 F (36.5 C)   Ht 5' 4 (1.626 m)   Wt 211 lb (95.7 kg)   SpO2 99%   BMI 36.22 kg/m  She  was weighed on the bioimpedance scale: Body mass index is 36.22 kg/m.  Body Fat%:38.2, Visceral Fat Rating:10, Weight trend over the last 12 months: Decreasing  General:  Alert, oriented and cooperative. Patient is in no acute distress.  Respiratory: Normal respiratory effort, no problems with respiration noted   Gait: able to ambulate independently  Mental Status: Normal mood and affect. Normal behavior. Normal judgment and thought content.   DIAGNOSTIC DATA REVIEWED:  BMET    Component Value Date/Time   NA 140 06/07/2023 1626   NA 142 12/01/2019 1459   K 3.7 06/07/2023 1626   CL 103 06/07/2023 1626   CO2 29 06/07/2023 1626   GLUCOSE 97 06/07/2023 1626   BUN 12 06/07/2023 1626   BUN 12 12/01/2019 1459   CREATININE 0.88 06/07/2023 1626   CALCIUM 10.0 06/07/2023 1626   GFRNONAA >60 04/04/2022 2031   GFRAA 102 12/01/2019 1459   Lab Results  Component Value Date   HGBA1C 5.9 06/07/2023   HGBA1C 5.7 (H) 08/26/2017   No results found for: INSULIN CBC    Component Value Date/Time   WBC 4.5 06/07/2023 1626   RBC 5.15 (H) 06/07/2023 1626   HGB 14.1 06/07/2023 1626   HGB 13.8 12/01/2019 1459   HCT 42.5 06/07/2023 1626   HCT 41.8 12/01/2019 1459   PLT 266.0 06/07/2023 1626   PLT 290 12/01/2019 1459   MCV 82.4 06/07/2023 1626   MCV 81 12/01/2019 1459   MCH 27.0 04/04/2022 2031   MCHC 33.1 06/07/2023 1626   RDW 13.5 06/07/2023 1626   RDW 13.6 12/01/2019 1459  Iron/TIBC/Ferritin/ %Sat No results found for: IRON, TIBC, FERRITIN, IRONPCTSAT Lipid Panel     Component Value Date/Time   CHOL 180 06/07/2023 1626   CHOL 138 08/26/2017 0820   TRIG 239.0 (H) 06/07/2023 1626   HDL 47.60 06/07/2023 1626   HDL 44 08/26/2017 0820   CHOLHDL 4 06/07/2023 1626   VLDL 47.8 (H) 06/07/2023 1626   LDLCALC 85 06/07/2023 1626   LDLCALC 57 08/26/2017 0820   LDLDIRECT 74.0 06/03/2022 1643   Hepatic Function Panel     Component Value Date/Time   PROT 7.8 06/07/2023 1626    PROT 7.6 12/01/2019 1459   ALBUMIN 4.7 06/07/2023 1626   ALBUMIN 4.4 12/01/2019 1459   AST 38 (H) 06/07/2023 1626   ALT 42 (H) 06/07/2023 1626   ALKPHOS 46 06/07/2023 1626   BILITOT 0.6 06/07/2023 1626   BILITOT 0.4 12/01/2019 1459   BILIDIR 0.1 07/06/2022 0744      Component Value Date/Time   TSH 1.62 06/07/2023 1626     Assessment and Plan   Essential hypertension  Prediabetes  Obstructive sleep apnea syndrome  Irritable bowel syndrome with constipation  Hypertriglyceridemia  Elevated LFTs  Obesity (BMI 30-39.9)   Assessment and Plan         ESTABLISH WITH [facility]   Obesity Treatment / Action Plan:  Patient will work on garnering support from family and friends to begin weight loss journey. Will work on eliminating or reducing the presence of highly palatable, calorie dense foods in the home. Will complete provided nutritional and psychosocial assessment questionnaire before the next appointment. Will be scheduled for indirect calorimetry to determine resting energy expenditure in a fasting state.  This will allow us  to create a reduced calorie, high-protein meal plan to promote loss of fat mass while preserving muscle mass. Counseled on the health benefits of losing 5%-15% of total body weight. Was counseled on nutritional approaches to weight loss and benefits of reducing processed foods and consuming plant-based foods and high quality protein as part of nutritional weight management. Was counseled on pharmacotherapy and role as an adjunct in weight management.   Obesity Education Performed Today:  She was weighed on the bioimpedance scale and results were discussed and documented in the synopsis.  We discussed obesity as a disease and the importance of a more detailed evaluation of all the factors contributing to the disease.  We discussed the importance of long term lifestyle changes which include nutrition, exercise and behavioral modifications as well as the  importance of customizing this to her specific health and social needs.  We discussed the benefits of reaching a healthier weight to alleviate the symptoms of existing conditions and reduce the risks of the biomechanical, metabolic and psychological effects of obesity.  We reviewed the four pillars of obesity medicine and importance of using a multimodal approach.  We reviewed the basic principles in weight management.   Diane Townsend appears to be in the action stage of change and states they are ready to start intensive lifestyle modifications and behavioral modifications.  I have spent 30 minutes in the care of the patient today including: 4 minutes before the visit reviewing and preparing the chart. 24 minutes face-to-face assessing and reviewing listed medical problems as outlined in obesity care plan, providing nutritional and behavioral counseling on topics outlined in the obesity care plan, counseling regarding anti-obesity medication as outlined in obesity care plan, independently interpreting test results and goals of care, as described in assessment and plan, and reviewing and  discussing biometric information and progress 2 minutes after the visit updating chart and documentation of encounter.  Reviewed by clinician on day of visit: allergies, medications, problem list, medical history, surgical history, family history, social history, and previous encounter notes pertinent to obesity diagnosis.  Diane Townsend d. Len Kluver, NP-C

## 2024-02-07 ENCOUNTER — Telehealth: Payer: Self-pay | Admitting: Nurse Practitioner

## 2024-02-07 ENCOUNTER — Ambulatory Visit: Admitting: Primary Care

## 2024-02-07 NOTE — Telephone Encounter (Signed)
-----   Message from [facility] sent at 12/29/2023  2:44 PM EDT ----- Regarding: BP Call and see how BP has been going.   Look and see what BP has been at other doctors visits

## 2024-02-07 NOTE — Telephone Encounter (Signed)
 LMTCB

## 2024-02-07 NOTE — Telephone Encounter (Signed)
 Call and see how BP has been going.

## 2024-02-08 DIAGNOSIS — Z0279 Encounter for issue of other medical certificate: Secondary | ICD-10-CM

## 2024-02-08 NOTE — Progress Notes (Unsigned)
 @Patient  ID: Diane Townsend, female    DOB: 06-20-1976, 48 y.o.   MRN: [id number]  No chief complaint on file.   Referring provider: [name] Lynwood HERO, NP  HPI:  48 year old female, never smoked.  Past medical history significant for OSA, pretension, obesity.  Patient of Dr. Shellia.   Previous [facility] pulmonary encounter: 12/27/2020 Patient presents today for 1 year follow-up OSA/daytime sleepiness.  She is doing very well. No acute complaints. No issues with pressure setting or mask fit. She is 100% compliant with CPAP, using on average 8 hours and 42 minutes a night.  CPAP pressure 13 cm H2O, residual AHI 2.3. She takes Armodafinil  150mg  daily. Epworth score 3/40. She is going through some adjustments at home, her husband was recently placed in Skilled nursing facility d/t his past stroke.   Airview download 11/25/20-12/24/20: 30/30 days (100%); 30 days (100%) > 4 hours Average usage 8 hours 42 mins Pressure 13 cm h20 Airleaks 2.9L/min (95%) AHI 2.3   12/29/2021 Patient presents today for a 1 year follow-up OSA.  She is 100% compliant with CPAP use from 11/26/2021 - 12/25/2021.  Current pressure settings 13 cm H2O with residual AHI 1.7/hr. No issues sleeping at night. She gets 8 hours of sleep a night. Her husband past in February, she was her main care taker. She is maintained on Armodafinil  (Nuvigil ) 150mg  daily for residual daytime sleepiness in patient with OSA.  She is more alert during the daytime when taking medication. No residual daytime sleepiness. Epworth score 2/24.  Airview download 11/26/2021-12/25/2021 Usage 30/30 days; 100% greater than 4 hours Average usage 8 hours 37 minutes Pressure 13 cm H2O Air leaks 8.6 L/min (95%) AHI 1.7  12/21/2022 Patient presents today for annual OSA follow-up.  Patient is doing well today without acute complaints.  She is 100% compliant with CPAP and reports benefit from use. Sleeping well at night.  Average usage 9 hours a night.  Current CPAP pressure 13 cm  H2O.  She will be eligible for a new CPAP in November 2024. No issues with mask fit or pressure settings.  She takes Nuvigil  150 mg daily in the morning.  She has no residual daytime sleepiness or sudden sleep attacks.  Her blood pressure slightly elevated today at 140/90, she did not take Norvasc  today.  She states she does not take blood pressure medication every day.  She is working on weight loss efforts, her weight is down 5 to 10 pounds since last year.    Airview download 11/18/22-12/17/22 30/30 days (100%) greater than 4 hours Average usage 9 hours 3 minutes Pressure 13 cm H2O Air leaks 5.4L/min AHI 2.2         No Known Allergies  Immunization History  Administered Date(s) Administered   Influenza Inj Mdck Quad With Preservative 05/29/2019   Influenza, Seasonal, Injecte, Preservative Fre 06/07/2023   Influenza,inj,Quad PF,6+ Mos 05/23/2018, 05/29/2020, 06/02/2021, 06/03/2022   Influenza,inj,quad, With Preservative 05/28/2016   PFIZER(Purple Top)SARS-COV-2 Vaccination 10/27/2019, 11/16/2019   Pneumococcal Polysaccharide-23 07/20/2008   Tdap 05/23/2018    Past Medical History:  Diagnosis Date   Hypertensive disorder 05/09/2015   IBS (irritable bowel syndrome)    Obstructive sleep apnea syndrome 04/26/2014    Tobacco History: Social History   Tobacco Use  Smoking Status Never  Smokeless Tobacco Never   Counseling given: Not Answered   Outpatient Medications Prior to Visit  Medication Sig Dispense Refill   amLODipine  (NORVASC ) 10 MG tablet Take 1 tablet (10 mg total) by mouth  daily. 90 tablet 1   Armodafinil  150 MG tablet TAKE 1 TABLET BY MOUTH DAILY AT 9 AM 30 tablet 2   LINZESS  145 MCG CAPS capsule TAKE 1 CAPSULE(145 MCG) BY MOUTH DAILY BEFORE BREAKFAST 30 capsule 5   Multiple Vitamins-Minerals (MULTIVITAMIN ADULT PO) Vitamin-Mineral Supplement oral liquid     Semaglutide -Weight Management (WEGOVY ) 1 MG/0.5ML SOAJ Inject 1 mg into the skin once a week. 2 mL 0    No facility-administered medications prior to visit.      Review of Systems  Review of Systems   Physical Exam  There were no vitals taken for this visit. Physical Exam   Lab Results:  CBC    Component Value Date/Time   WBC 4.5 06/07/2023 1626   RBC 5.15 (H) 06/07/2023 1626   HGB 14.1 06/07/2023 1626   HGB 13.8 12/01/2019 1459   HCT 42.5 06/07/2023 1626   HCT 41.8 12/01/2019 1459   PLT 266.0 06/07/2023 1626   PLT 290 12/01/2019 1459   MCV 82.4 06/07/2023 1626   MCV 81 12/01/2019 1459   MCH 27.0 04/04/2022 2031   MCHC 33.1 06/07/2023 1626   RDW 13.5 06/07/2023 1626   RDW 13.6 12/01/2019 1459    BMET    Component Value Date/Time   NA 140 06/07/2023 1626   NA 142 12/01/2019 1459   K 3.7 06/07/2023 1626   CL 103 06/07/2023 1626   CO2 29 06/07/2023 1626   GLUCOSE 97 06/07/2023 1626   BUN 12 06/07/2023 1626   BUN 12 12/01/2019 1459   CREATININE 0.88 06/07/2023 1626   CALCIUM 10.0 06/07/2023 1626   GFRNONAA >60 04/04/2022 2031   GFRAA 102 12/01/2019 1459    BNP No results found for: BNP  ProBNP No results found for: PROBNP  Imaging: No results found.   Assessment & Plan:   No problem-specific Assessment & Plan notes found for this encounter.     Diane LELON Ferrari, NP 02/08/2024

## 2024-02-08 NOTE — Telephone Encounter (Signed)
 Left voicemail for patient to call the office back.

## 2024-02-09 ENCOUNTER — Telehealth: Payer: Self-pay | Admitting: Nurse Practitioner

## 2024-02-09 ENCOUNTER — Telehealth: Payer: Self-pay

## 2024-02-09 ENCOUNTER — Ambulatory Visit: Admitting: Primary Care

## 2024-02-09 ENCOUNTER — Encounter: Payer: Self-pay | Admitting: Primary Care

## 2024-02-09 VITALS — BP 138/86 | HR 88 | Temp 98.4°F | Ht 64.0 in | Wt 216.4 lb

## 2024-02-09 DIAGNOSIS — G4733 Obstructive sleep apnea (adult) (pediatric): Secondary | ICD-10-CM

## 2024-02-09 NOTE — Patient Instructions (Signed)
  VISIT SUMMARY: Today, you came in for your one-year follow-up appointment to discuss your obstructive sleep apnea and other health concerns. We reviewed your current treatment and made some adjustments to help manage your conditions better.  YOUR PLAN: -OBSTRUCTIVE SLEEP APNEA: Obstructive sleep apnea is a condition where your airway becomes blocked during sleep, causing breathing pauses. Your CPAP therapy is working well, but we will order a new CPAP machine if your insurance allows. Please continue using your nasal mask and change supplies regularly. We will schedule a virtual compliance check within 30-90 days of receiving the new machine, ensuring you have 30 days of data for this check.  -INSOMNIA: Insomnia is difficulty falling or staying asleep. Your recent insomnia seems related to changes in your work schedule and increased anxiety. We will monitor the situation to see if it improves with potential work accommodations.  -ANXIETY DISORDER: Anxiety disorder involves excessive worry and stress. Your anxiety has increased due to your commute to the office. You are working on paperwork for reasonable accommodations to work from home, which may help reduce your anxiety.  -OBESITY: Obesity is a condition where you have an excessive amount of body fat. Your BMI is 37. We discussed the potential for insurance coverage of a new medication called Zepbound, which may help with both sleep apnea and obesity. We will check with your insurance for coverage and discuss the potential prescription with your primary care or pulmonary provider if it is covered.  -IRRITABLE BOWEL SYNDROME WITH CONSTIPATION: IBS-C is a condition that affects your digestive system, causing constipation. You are managing it with Linzess . It is important to stay on top of your Linzess  to manage constipation, especially when taking medications like Wegovy .  INSTRUCTIONS: We will order a new CPAP machine if your insurance allows.  Please continue using your nasal mask and change supplies regularly. We will schedule a virtual compliance check within 30-90 days of receiving the new machine, ensuring you have 30 days of data for this check. We will also check with your insurance for coverage of Zepbound and discuss the potential prescription with your primary care or pulmonary provider if it is covered.  Follow-up 1 year with Beth NP (or within 31-90 days if you receive a new CPAP machine)

## 2024-02-09 NOTE — Telephone Encounter (Signed)
 Copied from CRM [id number]. Topic: General - Call Back - No Documentation >> Feb 08, 2024  5:18 PM Paige D wrote: Reason for CRM: Pt called back in regards to a VM to give the office a call back. Pt would like a call back .

## 2024-02-09 NOTE — Telephone Encounter (Signed)
 Copied from CRM [id number]. Topic: General - Other >> Feb 09, 2024  8:28 AM Burnard DEL wrote: Reason for CRM: Patient returned call to CMA regarding b/p and stated that she went to weight management clinic and they checked her blood pressure and it has been doing good.s well as at home ,it has been good as well. She is still requesting a phone call from Janae regarding some paperwork that she needs provider to fill out ,and wanted to ask Janae a few questions before she brings it in to drop off.

## 2024-02-09 NOTE — Telephone Encounter (Signed)
 Patient dropped off paperwork to be filled out by provider. She would like it faxed over to number on paperwork and call when ready for pick up. Patient would like a copy as well

## 2024-02-10 NOTE — Telephone Encounter (Signed)
 Called pt back due to having questions about form she turned in yesterday.   Pt would like pcp to know that she is applying to go back to working from home due to IBS.  Pt states that there needs to be certain words used to get an approval. Gastrointestinal  States that she attached a sheet to the form and has filled out everything on her end.    Blood pressure:  she went to weight management clinic and they checked her blood pressure and it has been doing good.s well as at home ,it has been good as well

## 2024-02-10 NOTE — Telephone Encounter (Signed)
 See other encounter.

## 2024-02-10 NOTE — Telephone Encounter (Signed)
 Form is faxed to number on ppw. Copy made for office. Pt notified of faxed ppw via mychart.

## 2024-02-10 NOTE — Telephone Encounter (Signed)
 Left voicemail for patient to return call to office.

## 2024-02-10 NOTE — Telephone Encounter (Signed)
 Can we process pre-auth for zepbound? Do I need to send the prescription in for this?

## 2024-02-10 NOTE — Telephone Encounter (Signed)
 Form completed and placed in outgoing MA box

## 2024-02-10 NOTE — Telephone Encounter (Signed)
**Note De-identified  [place] Obfuscation** Please advise 

## 2024-02-11 ENCOUNTER — Telehealth: Payer: Self-pay | Admitting: Primary Care

## 2024-02-11 ENCOUNTER — Other Ambulatory Visit (HOSPITAL_COMMUNITY): Payer: Self-pay

## 2024-02-11 ENCOUNTER — Telehealth: Payer: Self-pay

## 2024-02-11 NOTE — Telephone Encounter (Signed)
 Per Arvella at [facility],  FYI, patient received last pap unit 06/08/2019. I sent in for process review but she may not be eligable unit 06/07/2024.  I have asked Brad to keep me informed on this and I will keep you updated.

## 2024-02-11 NOTE — Telephone Encounter (Signed)
 Thanks

## 2024-02-11 NOTE — Telephone Encounter (Signed)
*  Pulm  Pharmacy Patient Advocate Encounter   Received notification from Patient Advice Request messages that prior authorization for Zepbound 2.5MG /0.5ML pen-injectors  is required/requested.   Insurance verification completed.   The patient is insured through [facility] .   Per test claim: PA required; PA submitted to above mentioned insurance via CoverMyMeds Key/confirmation #/EOC [id number] Status is pending

## 2024-02-11 NOTE — Telephone Encounter (Signed)
 Patient picked up forms advised there is a 29.0 fee she will be billed

## 2024-02-11 NOTE — Telephone Encounter (Signed)
 Thank you. Diane Townsend please let patient know PA has been submitted for zepbound, she will need a follow-up in 4 weeks. Can we mail patient education on zepbound to her

## 2024-02-14 ENCOUNTER — Telehealth: Payer: Self-pay

## 2024-02-14 NOTE — Telephone Encounter (Signed)
 Dr Hope I got the denial for Zepbound from [employer] and I wondering if you would appeal it saying that its for sleep apnea. Im sure you did but maybe if we appeal they will change their decision.    Thanks Diane Townsend  Routing to Landry Hope, NP and the PA team to advise.

## 2024-02-14 NOTE — Telephone Encounter (Signed)
 This is to inform you that we have received the medical information that your office has submitted for the above member's Zepbound  (tirzepatide ) Exception request and at this time, the information submitted did not meet the plan's criteria for medical necessity due to the following reason:. the use of this medication without inadequate treatment response, intolerance, or contraindication to at least TWO oral medications for weight management (e.g., benzphetamine, diethylpropion, phentermine, Qsymia, etc.) does not establish medical necessity for this drug. Medical necessity is determined by adherence to generally accepted standards of medical practice in the [place] , is clinically appropriate, in terms of type, frequency, extent, site, duration and considered effective for the patient's illness, injury, disease, or its symptoms. For more information reference your plan brochure.

## 2024-02-15 NOTE — Telephone Encounter (Signed)
Thanks Juliana!

## 2024-02-15 NOTE — Telephone Encounter (Signed)
 There is no other FDA approved medication except for zepbound  for the treatment of OSA. Please submit an appeal with following verbiage.   Patient has moderate sleep apnea related to obesity with BM 37, posing significant cardiovascular risks. Patient has failed traditional weight loss measures with diet and exercise for >/6 months. Patient will be initiated on Zepbound (tirzepatide) for weight management.   Zepbound is the only pharmaceutical treatment approved for moderate-to-severe OSA in adults who are overweight (BMI >/27) or obese (BMI >/30). The patient will continue lifestyle modifications, including structured nutrition and physical activity as directed. No other GLP1 therapy will be used simultaneously at this time. The patient does not have any FDA labeled contraindications to this agent, including pregnancy, lactation, hx or family history of medullary thyroid  cancer, or multiple endocrine neoplasia type II. Side effect profile has been reviewed with patient. Aware of red flag symptoms to notify of immediately or seek emergency care, including severe nausea/vomiting, inability to pass bowels or gas, severe abdominal pain/tenderness, jaundice.

## 2024-02-15 NOTE — Telephone Encounter (Signed)
 You're welcome!

## 2024-02-15 NOTE — Telephone Encounter (Signed)
 Information has been sent to clinical pharmacist for appeals review. It may take 5-7 days to prepare the necessary documentation to request the appeal from the insurance.

## 2024-02-15 NOTE — Telephone Encounter (Signed)
 Please let patient know that her insurance denied Zepbound, she has to have tried at least two oral medications first. We do not prescribe these medication and needs to either speak with her PCP or we can refer her to weight management.

## 2024-02-16 ENCOUNTER — Telehealth: Payer: Self-pay | Admitting: Pharmacist

## 2024-02-16 ENCOUNTER — Telehealth: Admitting: Physician Assistant

## 2024-02-16 DIAGNOSIS — R109 Unspecified abdominal pain: Secondary | ICD-10-CM

## 2024-02-16 DIAGNOSIS — R1110 Vomiting, unspecified: Secondary | ICD-10-CM

## 2024-02-16 NOTE — Telephone Encounter (Signed)
 Appeal has been submitted for Zepbound. Will advise when response is received, please be advised that most companies may take 30 days to make a decision. Appeal letter and supporting documentation have been faxed to [phone] on 02/16/2024 @11 :28 am.  Thank you, Devere Pandy, PharmD Clinical Pharmacist  [facility]  Direct Dial: [phone]

## 2024-02-17 ENCOUNTER — Ambulatory Visit: Payer: Self-pay | Admitting: *Deleted

## 2024-02-17 NOTE — Telephone Encounter (Signed)
 Noted. Will evaluate her in office tomorrow. Looks like she is scheduled for an in person appt. That is my preference, not video

## 2024-02-17 NOTE — Progress Notes (Signed)
  Because of severity of pain and active vomiting with concern for diverticulitis flare, I feel your condition warrants further evaluation and I recommend that you be seen in a face-to-face visit.   NOTE: There will be NO CHARGE for this E-Visit   If you are having a true medical emergency, please call 911.     For an urgent face to face visit, [facility] has multiple urgent care centers for your convenience.  Click the link below for the full list of locations and hours, walk-in wait times, appointment scheduling options and driving directions:  Urgent Care - [place], [place], [place], [place], [place], [place]  [place]     Your MyChart E-visit questionnaire answers were reviewed by a board certified advanced clinical practitioner to complete your personal care plan based on your specific symptoms.    Thank you for using e-Visits.

## 2024-02-17 NOTE — Telephone Encounter (Signed)
 Copied from CRM [id number]. Topic: Clinical - Red Word Triage >> Feb 17, 2024  9:18 AM Marissa P wrote: Red Word that prompted transfer to Nurse Triage: Patient has a diverticulitis flare, throwing up and stomach pains. Would like a video appt as soon as possible please as oppose to coming in if possible please. Reason for Disposition  [1] MILD or MODERATE vomiting AND [2] present > 48 hours (2 days)  (Exception: Mild vomiting with associated diarrhea.)  Answer Assessment - Initial Assessment Questions 1. VOMITING SEVERITY: How many times have you vomited in the past 24 hours?      I'm throwing and up and having abd pain.   I have IBS and diverticulitis.   My stomach is full and I've vomited twice.   I had a flare up in 2023 like this.   2. ONSET: When did the vomiting begin?      Yesterday.   10:00 AM this all started yesterday.   My stomach just suddenly started hurting.   Nothing is relieving.    Nothing is helping the nausea and vomiting.    3. FLUIDS: What fluids or food have you vomited up today? Have you been able to keep any fluids down?     I don't have an appetite.   The soup and water stayed down.   No vomiting today.    4. ABDOMEN PAIN: Are your having any abdomen pain? If Yes : How bad is it and what does it feel like? (e.g., crampy, dull, intermittent, constant)      All through the night it's not gotten better.  5. DIARRHEA: Is there any diarrhea? If Yes, ask: How many times today?      No diarrhea       Constipation  I did have a small BM yesterday.    6. CONTACTS: Is there anyone else in the family with the same symptoms?      Not asked 7. CAUSE: What do you think is causing your vomiting?     Diverticulitis flare up.   I had this in 2023. Right now I feel ok. 8. HYDRATION STATUS: Any signs of dehydration? (e.g., dry mouth [not only dry lips], too weak to stand) When did you last urinate?     I'm ok.   Keeping water and soup down. 9. OTHER SYMPTOMS: Do  you have any other symptoms? (e.g., fever, headache, vertigo, vomiting blood or coffee grounds, recent head injury)     See above 10. PREGNANCY: Is there any chance you are pregnant? When was your last menstrual period?       Not asked  Protocols used: Vomiting-A-AH       FYI Only or Action Required?: Action required by provider: clinical question for provider.  Patient was last seen in primary care on 02/02/2024 by Jonel Rockie BIRCH, NP.  Called Nurse Triage reporting Vomiting.  Symptoms began yesterday morning with abd pain and vomited twice.   Diagnosed with diverticulitis in 2023.   Having the same symptoms.  Has issues with constipation.   Takes Linzess  daily.  Had a small BM yesterday.    Interventions attempted: Prescription medications: Did not take her Linzess  today. Seeking advice on whether she should take it today or not.  Can answer via MyChart.   Has an appt at 9:20 on 8/1 with Lynwood Crandall, NP.  Symptoms are: stable. No vomiting and feels better today but still feels like she is constipated.  Triage Disposition: See Physician Within  24 Hours  Patient/caregiver understands and will follow disposition?: Yes Sent her question regarding whether to take her Linzess  to day or not to the office.   She is fine with an answer via MyChart.

## 2024-02-18 ENCOUNTER — Ambulatory Visit: Admitting: Nurse Practitioner

## 2024-02-18 VITALS — BP 136/72 | HR 80 | Temp 98.2°F | Ht 64.0 in | Wt 214.0 lb

## 2024-02-18 DIAGNOSIS — R112 Nausea with vomiting, unspecified: Secondary | ICD-10-CM

## 2024-02-18 DIAGNOSIS — R197 Diarrhea, unspecified: Secondary | ICD-10-CM | POA: Diagnosis not present

## 2024-02-18 DIAGNOSIS — R1030 Lower abdominal pain, unspecified: Secondary | ICD-10-CM

## 2024-02-18 LAB — CBC WITH DIFFERENTIAL/PLATELET
Basophils Absolute: 0.1 K/uL (ref 0.0–0.1)
Basophils Relative: 1.1 % (ref 0.0–3.0)
Eosinophils Absolute: 0.2 K/uL (ref 0.0–0.7)
Eosinophils Relative: 2.6 % (ref 0.0–5.0)
HCT: 39.7 % (ref 36.0–46.0)
Hemoglobin: 13.1 g/dL (ref 12.0–15.0)
Lymphocytes Relative: 39 % (ref 12.0–46.0)
Lymphs Abs: 2.7 K/uL (ref 0.7–4.0)
MCHC: 33 g/dL (ref 30.0–36.0)
MCV: 80.7 fl (ref 78.0–100.0)
Monocytes Absolute: 0.5 K/uL (ref 0.1–1.0)
Monocytes Relative: 6.9 % (ref 3.0–12.0)
Neutro Abs: 3.5 K/uL (ref 1.4–7.7)
Neutrophils Relative %: 50.4 % (ref 43.0–77.0)
Platelets: 323 K/uL (ref 150.0–400.0)
RBC: 4.92 Mil/uL (ref 3.87–5.11)
RDW: 13.9 % (ref 11.5–15.5)
WBC: 6.9 K/uL (ref 4.0–10.5)

## 2024-02-18 LAB — COMPREHENSIVE METABOLIC PANEL WITH GFR
ALT: 21 U/L (ref 0–35)
AST: 21 U/L (ref 0–37)
Albumin: 4.1 g/dL (ref 3.5–5.2)
Alkaline Phosphatase: 54 U/L (ref 39–117)
BUN: 10 mg/dL (ref 6–23)
CO2: 30 meq/L (ref 19–32)
Calcium: 9.2 mg/dL (ref 8.4–10.5)
Chloride: 102 meq/L (ref 96–112)
Creatinine, Ser: 0.86 mg/dL (ref 0.40–1.20)
GFR: 79.52 mL/min (ref >60.00)
Glucose, Bld: 96 mg/dL (ref 70–99)
Potassium: 3.6 meq/L (ref 3.5–5.1)
Sodium: 138 meq/L (ref 135–145)
Total Bilirubin: 0.3 mg/dL (ref 0.2–1.2)
Total Protein: 7.7 g/dL (ref 6.0–8.3)

## 2024-02-18 LAB — LIPASE: Lipase: 33 U/L (ref 11.0–59.0)

## 2024-02-18 MED ORDER — AMOXICILLIN-POT CLAVULANATE 875-125 MG PO TABS: 1.0000 | ORAL_TABLET | Freq: Two times a day (BID) | ORAL | 0 refills | Status: AC

## 2024-02-18 MED ORDER — ONDANSETRON 4 MG PO TBDP: 4.0000 mg | ORAL_TABLET | Freq: Three times a day (TID) | ORAL | 0 refills | Status: DC | PRN

## 2024-02-18 NOTE — Assessment & Plan Note (Signed)
 Likely secondary to diverticulitis will be treat with Augmentin 

## 2024-02-18 NOTE — Telephone Encounter (Signed)
 Additional information has been requested from the patient's insurance in order to proceed with the appeal request. Requested information has been sent, or form has been filled out and faxed back to [phone]

## 2024-02-18 NOTE — Patient Instructions (Signed)
 Nice to see you today I will be in touch with the labs once I have them I have sent in antibiotics to the pharmacy along with nausea medications  If you have fever, chills, vomiting (where you are not keeping anything down), stomach get hard and more painful go to the emergency department

## 2024-02-18 NOTE — Progress Notes (Signed)
 Acute Office Visit  Subjective:     Patient ID: Diane Townsend, female    DOB: 02-21-1976, 48 y.o.   MRN: [id number]  Chief Complaint  Patient presents with   Diverticulitis flare up    Pt complains of stomach fullness, difficulty passing BM. States stomach is in knots, pain level 11. Pt complains of vomiting and having BM at the same time on Wednesday.      HPI Patient is in today for abdominal pain with a history of constipation, hypertension, OSA, hypersomnolence, obesity  Patient have a history of a laparoscopic hysterectomy, cholecystectomy, appendectomy and tubal ligation in regards to surgeries  Patient's last colonoscopy Was done on 10/06/2021 by Dr. Therisa she did have multiple small mouth diverticula in the sigmoid colon along with nonbleeding internal hemorrhoids. Patient did have a history of active diverticulitis last CT scan was 04/04/2022 which showed the same  States that it started approx 10am on Wednesday. She gets up around 630. States that she was working. States that she got a knot in her stomach. States that she did not have any food at that point. States that she took some zofran . States that she was able to have some diarrhea a small amount with vomiting. States that she is having abdominal fullness, pain, nausea, vomitng, and what she is calling diarrhea States that she is controlling the pain with tylenol and sleeping. She is able to keep down soups nad liquids States that she feels like someone is tying her intestines.   Review of Systems  Constitutional:  Positive for chills and fever.  Respiratory:  Negative for shortness of breath.   Cardiovascular:  Negative for chest pain.  Gastrointestinal:  Positive for abdominal pain, blood in stool, diarrhea, nausea and vomiting.  Genitourinary:  Negative for dysuria, frequency, hematuria and urgency.  Neurological:  Negative for dizziness and headaches.        Objective:    BP 136/72   Pulse 80   Temp 98.2 F  (36.8 C) (Oral)   Ht 5' 4 (1.626 m)   Wt 214 lb (97.1 kg)   SpO2 98%   BMI 36.73 kg/m    Physical Exam Vitals and nursing note reviewed.  Constitutional:      Appearance: Normal appearance.  Cardiovascular:     Rate and Rhythm: Normal rate and regular rhythm.     Heart sounds: Normal heart sounds.  Pulmonary:     Effort: Pulmonary effort is normal.     Breath sounds: Normal breath sounds.  Abdominal:     General: Bowel sounds are normal. There is no distension.     Palpations: There is no mass.     Tenderness: There is abdominal tenderness. There is guarding. There is no right CVA tenderness or left CVA tenderness.     Hernia: No hernia is present.   Neurological:     Mental Status: She is alert.     No results found for any visits on 02/18/24.      Assessment & Plan:   Problem List Items Addressed This Visit       Digestive   Nausea and vomiting   Ondansetron  4 mg 3 times daily as needed.  Advance diet as tolerated      Relevant Medications   ondansetron  (ZOFRAN -ODT) 4 MG disintegrating tablet   Diarrhea of presumed infectious origin   Likely secondary to diverticulitis will be treat with Augmentin         Other   Lower abdominal pain -  Primary   History of diverticulitis demonstrated on CT scan in the past.  Also diverticulum noticed on colonoscopy.  Will treat with Augmentin  875-125 mg twice daily for 7 days.  Pending CBC CMP and lipase.  Signs and symptoms reviewed when to seek emergent health care over the weekend.  Patient to advance diet as tolerates.  Drink plenty of fluids to stay hydrated      Relevant Medications   amoxicillin -clavulanate (AUGMENTIN ) 875-125 MG tablet   Other Relevant Orders   Comprehensive metabolic panel with GFR   CBC with Differential/Platelet   Lipase   Urinalysis w microscopic + reflex cultur    Meds ordered this encounter  Medications   amoxicillin -clavulanate (AUGMENTIN ) 875-125 MG tablet    Sig: Take 1 tablet by  mouth 2 (two) times daily for 7 days.    Dispense:  14 tablet    Refill:  0    Supervising Provider:   RANDEEN HARDY A [[id number]]   ondansetron  (ZOFRAN -ODT) 4 MG disintegrating tablet    Sig: Take 1 tablet (4 mg total) by mouth every 8 (eight) hours as needed.    Dispense:  20 tablet    Refill:  0    Supervising Provider:   RANDEEN HARDY A [[id number]]    Return if symptoms worsen or fail to improve.  Adina Crandall, NP

## 2024-02-18 NOTE — Assessment & Plan Note (Signed)
 History of diverticulitis demonstrated on CT scan in the past.  Also diverticulum noticed on colonoscopy.  Will treat with Augmentin  875-125 mg twice daily for 7 days.  Pending CBC CMP and lipase.  Signs and symptoms reviewed when to seek emergent health care over the weekend.  Patient to advance diet as tolerates.  Drink plenty of fluids to stay hydrated

## 2024-02-18 NOTE — Assessment & Plan Note (Signed)
 Ondansetron  4 mg 3 times daily as needed.  Advance diet as tolerated

## 2024-02-20 LAB — URINE CULTURE
MICRO NUMBER:: 16779310
Result:: NO GROWTH
SPECIMEN QUALITY:: ADEQUATE

## 2024-02-20 LAB — URINALYSIS W MICROSCOPIC + REFLEX CULTURE
Bilirubin Urine: NEGATIVE
Glucose, UA: NEGATIVE
Hgb urine dipstick: NEGATIVE
Ketones, ur: NEGATIVE
Nitrites, Initial: NEGATIVE
Specific Gravity, Urine: 1.023 (ref 1.001–1.035)
pH: 6 (ref 5.0–8.0)

## 2024-02-20 LAB — CULTURE INDICATED

## 2024-02-22 ENCOUNTER — Ambulatory Visit: Payer: Self-pay | Admitting: Nurse Practitioner

## 2024-03-02 NOTE — Telephone Encounter (Signed)
 Despite the appeal letter clearly stating that this medication is being prescribed for the treatment of OSA and included all relevant sleep study information, the insurance company responded with the following denial:

## 2024-03-08 NOTE — Telephone Encounter (Signed)
 I called and spoke to pt. Pt informed of the PA denial. Pt states she will call her ins company and see what they can cover and she will call us  back if she finds out anything. NFN

## 2024-03-13 NOTE — Telephone Encounter (Signed)
 Despite the appeal letter clearly stating that this medication is being prescribed for the treatment of OSA and included all relevant sleep study information, the insurance company responded with the following denial:     Unfortunately despite appeal insurance still denied Zepboud. Please have her follow-up with primary care for weight management, we do not prescribed these alternative. Encourage she reach out to them or we can refer to to [facility]

## 2024-03-13 NOTE — Telephone Encounter (Signed)
**Note De-identified  [place] Obfuscation** Please advise 

## 2024-03-27 ENCOUNTER — Other Ambulatory Visit: Payer: Self-pay | Admitting: Nurse Practitioner

## 2024-03-27 ENCOUNTER — Telehealth: Admitting: Physician Assistant

## 2024-03-27 ENCOUNTER — Encounter

## 2024-03-27 DIAGNOSIS — U071 COVID-19: Secondary | ICD-10-CM | POA: Diagnosis not present

## 2024-03-27 DIAGNOSIS — K5900 Constipation, unspecified: Secondary | ICD-10-CM

## 2024-03-27 MED ORDER — PROMETHAZINE-DM 6.25-15 MG/5ML PO SYRP: 5.0000 mL | ORAL_SOLUTION | Freq: Four times a day (QID) | ORAL | 0 refills | Status: DC | PRN

## 2024-03-27 MED ORDER — FLUTICASONE PROPIONATE 50 MCG/ACT NA SUSP: 2.0000 | Freq: Every day | NASAL | 0 refills | Status: DC

## 2024-03-27 MED ORDER — NIRMATRELVIR/RITONAVIR (PAXLOVID)TABLET: 3.0000 | ORAL_TABLET | Freq: Two times a day (BID) | ORAL | 0 refills | Status: AC

## 2024-03-27 NOTE — Progress Notes (Signed)
 Virtual Visit Consent   Malerie Eakins, you are scheduled for a virtual visit with a [facility] provider today. Just as with appointments in the office, your consent must be obtained to participate. Your consent will be active for this visit and any virtual visit you may have with one of our providers in the next 365 days. If you have a MyChart account, a copy of this consent can be sent to you electronically.  As this is a virtual visit, video technology does not allow for your provider to perform a traditional examination. This may limit your provider's ability to fully assess your condition. If your provider identifies any concerns that need to be evaluated in person or the need to arrange testing (such as labs, EKG, etc.), we will make arrangements to do so. Although advances in technology are sophisticated, we cannot ensure that it will always work on either your end or our end. If the connection with a video visit is poor, the visit may have to be switched to a telephone visit. With either a video or telephone visit, we are not always able to ensure that we have a secure connection.  By engaging in this virtual visit, you consent to the provision of healthcare and authorize for your insurance to be billed (if applicable) for the services provided during this visit. Depending on your insurance coverage, you may receive a charge related to this service.  I need to obtain your verbal consent now. Are you willing to proceed with your visit today? Siara Gorder has provided verbal consent on 03/27/2024 for a virtual visit (video or telephone). Delon CHRISTELLA Dickinson, PA-C  Date: 03/27/2024 12:41 PM   Virtual Visit via Video Note   I, Delon CHRISTELLA Dickinson, connected with  Luci Bellucci  ([id number], May 01, 1976) on 03/27/24 at 12:30 PM EDT by a video-enabled telemedicine application and verified that I am speaking with the correct person using two identifiers.  Location: Patient: Virtual Visit Location  Patient: Home Provider: Virtual Visit Location Provider: Home Office   I discussed the limitations of evaluation and management by telemedicine and the availability of in person appointments. The patient expressed understanding and agreed to proceed.    History of Present Illness: Any Mcneice is a 48 y.o. who identifies as a female who was assigned female at birth, and is being seen today for Covid 19.  HPI: URI  This is a new problem. The current episode started in the past 7 days (Tested positive for Covid on at home test; Symptoms started on Saturday 03/25/24). The problem has been gradually worsening. Maximum temperature: subjective fevers. Associated symptoms include congestion, coughing (worse with talking), headaches, a plugged ear sensation (popping with blowing nose), rhinorrhea and a sore throat. Pertinent negatives include no diarrhea, ear pain, nausea, sinus pain, vomiting or wheezing. Associated symptoms comments: Chills, body aches. Treatments tried: mucinex cold and flu, nyquil severe with honey. The treatment provided no relief.     Problems:  Patient Active Problem List   Diagnosis Date Noted   Lower abdominal pain 02/18/2024   Diarrhea of presumed infectious origin 02/18/2024   Hypertriglyceridemia 06/07/2023   Obesity (BMI 30-39.9) 06/07/2023   Upper respiratory tract infection 06/03/2022   Thyroid  nodule 06/03/2022   Sleep apnea with hypersomnolence 12/29/2021   Preventative health care 06/02/2021   Chronic idiopathic constipation 03/05/2021   Daytime sleepiness 12/27/2020   Caregiver stress 01/05/2020   Class 2 obesity due to excess calories without serious comorbidity with body mass index (BMI)  of 36.0 to 36.9 in adult 05/29/2019   IBS (irritable bowel syndrome)    Essential hypertension 05/09/2015   Lack of energy 05/09/2015   Nausea and vomiting 05/09/2015   Impacted cerumen 10/15/2014   Obstructive sleep apnea syndrome 04/26/2014    Allergies: No Known  Allergies Medications:  Current Outpatient Medications:    fluticasone  (FLONASE ) 50 MCG/ACT nasal spray, Place 2 sprays into both nostrils daily., Disp: 16 g, Rfl: 0   nirmatrelvir /ritonavir  (PAXLOVID ) 20 x 150 MG & 10 x 100MG  TABS, Take 3 tablets by mouth 2 (two) times daily for 5 days. (Take nirmatrelvir  150 mg two tablets twice daily for 5 days and ritonavir  100 mg one tablet twice daily for 5 days) Patient GFR is 79, Disp: 30 tablet, Rfl: 0   promethazine -dextromethorphan (PROMETHAZINE -DM) 6.25-15 MG/5ML syrup, Take 5 mLs by mouth 4 (four) times daily as needed., Disp: 118 mL, Rfl: 0   amLODipine  (NORVASC ) 10 MG tablet, Take 1 tablet (10 mg total) by mouth daily., Disp: 90 tablet, Rfl: 1   Armodafinil  150 MG tablet, TAKE 1 TABLET BY MOUTH DAILY AT 9 AM, Disp: 30 tablet, Rfl: 2   LINZESS  145 MCG CAPS capsule, TAKE 1 CAPSULE(145 MCG) BY MOUTH DAILY BEFORE BREAKFAST, Disp: 30 capsule, Rfl: 5   Multiple Vitamins-Minerals (MULTIVITAMIN ADULT PO), Vitamin-Mineral Supplement oral liquid, Disp: , Rfl:    ondansetron  (ZOFRAN -ODT) 4 MG disintegrating tablet, Take 1 tablet (4 mg total) by mouth every 8 (eight) hours as needed., Disp: 20 tablet, Rfl: 0  Observations/Objective: Patient is well-developed, well-nourished in no acute distress.  Resting comfortably at home.  Head is normocephalic, atraumatic.  No labored breathing.  Speech is clear and coherent with logical content.  Patient is alert and oriented at baseline.    Assessment and Plan: 1. COVID-19 (Primary) - nirmatrelvir /ritonavir  (PAXLOVID ) 20 x 150 MG & 10 x 100MG  TABS; Take 3 tablets by mouth 2 (two) times daily for 5 days. (Take nirmatrelvir  150 mg two tablets twice daily for 5 days and ritonavir  100 mg one tablet twice daily for 5 days) Patient GFR is 79  Dispense: 30 tablet; Refill: 0 - promethazine -dextromethorphan (PROMETHAZINE -DM) 6.25-15 MG/5ML syrup; Take 5 mLs by mouth 4 (four) times daily as needed.  Dispense: 118 mL; Refill:  0 - fluticasone  (FLONASE ) 50 MCG/ACT nasal spray; Place 2 sprays into both nostrils daily.  Dispense: 16 g; Refill: 0  - Continue OTC symptomatic management of choice - Will send OTC vitamins and supplement information through AVS - Paxlovid  prescribed - Promethazine  DM and Flonase  for symptomatic management - Push fluids - Rest as needed - Discussed return precautions and when to seek in-person evaluation, sent via AVS as well   Follow Up Instructions: I discussed the assessment and treatment plan with the patient. The patient was provided an opportunity to ask questions and all were answered. The patient agreed with the plan and demonstrated an understanding of the instructions.  A copy of instructions were sent to the patient via MyChart unless otherwise noted below.    The patient was advised to call back or seek an in-person evaluation if the symptoms worsen or if the condition fails to improve as anticipated.    Delon CHRISTELLA Dickinson, PA-C

## 2024-03-27 NOTE — Patient Instructions (Signed)
 Diane Townsend, thank you for joining Delon CHRISTELLA Dickinson, PA-C for today's virtual visit.  While this provider is not your primary care provider (PCP), if your PCP is located in our provider database this encounter information will be shared with them immediately following your visit.   A [place] MyChart account gives you access to today's visit and all your visits, tests, and labs performed at [facility]  click here if you don't have a [place] MyChart account or go to [email or web address]  Consent: (Patient) Diane Townsend provided verbal consent for this virtual visit at the beginning of the encounter.  Current Medications:  Current Outpatient Medications:    fluticasone  (FLONASE ) 50 MCG/ACT nasal spray, Place 2 sprays into both nostrils daily., Disp: 16 g, Rfl: 0   nirmatrelvir /ritonavir  (PAXLOVID ) 20 x 150 MG & 10 x 100MG  TABS, Take 3 tablets by mouth 2 (two) times daily for 5 days. (Take nirmatrelvir  150 mg two tablets twice daily for 5 days and ritonavir  100 mg one tablet twice daily for 5 days) Patient GFR is 79, Disp: 30 tablet, Rfl: 0   promethazine -dextromethorphan (PROMETHAZINE -DM) 6.25-15 MG/5ML syrup, Take 5 mLs by mouth 4 (four) times daily as needed., Disp: 118 mL, Rfl: 0   amLODipine  (NORVASC ) 10 MG tablet, Take 1 tablet (10 mg total) by mouth daily., Disp: 90 tablet, Rfl: 1   Armodafinil  150 MG tablet, TAKE 1 TABLET BY MOUTH DAILY AT 9 AM, Disp: 30 tablet, Rfl: 2   LINZESS  145 MCG CAPS capsule, TAKE 1 CAPSULE(145 MCG) BY MOUTH DAILY BEFORE BREAKFAST, Disp: 30 capsule, Rfl: 5   Multiple Vitamins-Minerals (MULTIVITAMIN ADULT PO), Vitamin-Mineral Supplement oral liquid, Disp: , Rfl:    ondansetron  (ZOFRAN -ODT) 4 MG disintegrating tablet, Take 1 tablet (4 mg total) by mouth every 8 (eight) hours as needed., Disp: 20 tablet, Rfl: 0   Medications ordered in this encounter:  Meds ordered this encounter  Medications   nirmatrelvir /ritonavir  (PAXLOVID ) 20 x  150 MG & 10 x 100MG  TABS    Sig: Take 3 tablets by mouth 2 (two) times daily for 5 days. (Take nirmatrelvir  150 mg two tablets twice daily for 5 days and ritonavir  100 mg one tablet twice daily for 5 days) Patient GFR is 79    Dispense:  30 tablet    Refill:  0    Supervising Provider:   BLAISE ALEENE KIDD [[id number]]   promethazine -dextromethorphan (PROMETHAZINE -DM) 6.25-15 MG/5ML syrup    Sig: Take 5 mLs by mouth 4 (four) times daily as needed.    Dispense:  118 mL    Refill:  0    Supervising Provider:   LAMPTEY, PHILIP O [id number]   fluticasone  (FLONASE ) 50 MCG/ACT nasal spray    Sig: Place 2 sprays into both nostrils daily.    Dispense:  16 g    Refill:  0    Supervising Provider:   BLAISE ALEENE KIDD [[id number]]     *If you need refills on other medications prior to your next appointment, please contact your pharmacy*  Follow-Up: Call back or seek an in-person evaluation if the symptoms worsen or if the condition fails to improve as anticipated.  [facility] [phone]  Care Instructions: Can take to lessen severity (if able): Vit C 500mg  twice daily Quercertin 250-500mg  twice daily Zinc 75-100mg  daily Melatonin 3-6 mg at bedtime Vit D3 1000-2000 IU daily Aspirin 81 mg daily with food Optional: Famotidine 20mg  daily Also can add tylenol/ibuprofen as needed for fevers and body aches  May add Mucinex or Mucinex DM as needed for cough/congestion    Isolation Instructions: You are to isolate at home until you have been fever free for at least 24 hours without a fever-reducing medication, and symptoms have been steadily improving for 24 hours. At that time,  you can end isolation but need to mask for an additional 5 days.   If you must be around other household members who do not have symptoms, you need to make sure that both you and the family members are masking consistently with a high-quality mask.  If you note any worsening of symptoms despite treatment,  please seek an in-person evaluation ASAP. If you note any significant shortness of breath or any chest pain, please seek ER evaluation. Please do not delay care!   COVID-19: What to Do if You Are Sick If you test positive and are an older adult or someone who is at high risk of getting very sick from COVID-19, treatment may be available. Contact a healthcare provider right away after a positive test to determine if you are eligible, even if your symptoms are mild right now. You can also visit a Test to Treat location and, if eligible, receive a prescription from a provider. Don't delay: Treatment must be started within the first few days to be effective. If you have a fever, cough, or other symptoms, you might have COVID-19. Most people have mild illness and are able to recover at home. If you are sick: Keep track of your symptoms. If you have an emergency warning sign (including trouble breathing), call 911. Steps to help prevent the spread of COVID-19 if you are sick If you are sick with COVID-19 or think you might have COVID-19, follow the steps below to care for yourself and to help protect other people in your home and community. Stay home except to get medical care Stay home. Most people with COVID-19 have mild illness and can recover at home without medical care. Do not leave your home, except to get medical care. Do not visit public areas and do not go to places where you are unable to wear a mask. Take care of yourself. Get rest and stay hydrated. Take over-the-counter medicines, such as acetaminophen, to help you feel better. Stay in touch with your doctor. Call before you get medical care. Be sure to get care if you have trouble breathing, or have any other emergency warning signs, or if you think it is an emergency. Avoid public transportation, ride-sharing, or taxis if possible. Get tested If you have symptoms of COVID-19, get tested. While waiting for test results, stay away from  others, including staying apart from those living in your household. Get tested as soon as possible after your symptoms start. Treatments may be available for people with COVID-19 who are at risk for becoming very sick. Don't delay: Treatment must be started early to be effective--some treatments must begin within 5 days of your first symptoms. Contact your healthcare provider right away if your test result is positive to determine if you are eligible. Self-tests are one of several options for testing for the virus that causes COVID-19 and may be more convenient than laboratory-based tests and point-of-care tests. Ask your healthcare provider or your local health department if you need help interpreting your test results. You can visit your state, tribal, local, and territorial health department's website to look for the latest local information on testing sites. Separate yourself from other people As much as possible, stay  in a specific room and away from other people and pets in your home. If possible, you should use a separate bathroom. If you need to be around other people or animals in or outside of the home, wear a well-fitting mask. Tell your close contacts that they may have been exposed to COVID-19. An infected person can spread COVID-19 starting 48 hours (or 2 days) before the person has any symptoms or tests positive. By letting your close contacts know they may have been exposed to COVID-19, you are helping to protect everyone. See COVID-19 and Animals if you have questions about pets. If you are diagnosed with COVID-19, someone from the health department may call you. Answer the call to slow the spread. Monitor your symptoms Symptoms of COVID-19 include fever, cough, or other symptoms. Follow care instructions from your healthcare provider and local health department. Your local health authorities may give instructions on checking your symptoms and reporting information. When to seek  emergency medical attention Look for emergency warning signs* for COVID-19. If someone is showing any of these signs, seek emergency medical care immediately: Trouble breathing Persistent pain or pressure in the chest New confusion Inability to wake or stay awake Pale, gray, or blue-colored skin, lips, or nail beds, depending on skin tone *This list is not all possible symptoms. Please call your medical provider for any other symptoms that are severe or concerning to you. Call 911 or call ahead to your local emergency facility: Notify the operator that you are seeking care for someone who has or may have COVID-19. Call ahead before visiting your doctor Call ahead. Many medical visits for routine care are being postponed or done by phone or telemedicine. If you have a medical appointment that cannot be postponed, call your doctor's office, and tell them you have or may have COVID-19. This will help the office protect themselves and other patients. If you are sick, wear a well-fitting mask You should wear a mask if you must be around other people or animals, including pets (even at home). Wear a mask with the best fit, protection, and comfort for you. You don't need to wear the mask if you are alone. If you can't put on a mask (because of trouble breathing, for example), cover your coughs and sneezes in some other way. Try to stay at least 6 feet away from other people. This will help protect the people around you. Masks should not be placed on young children under age 22 years, anyone who has trouble breathing, or anyone who is not able to remove the mask without help. Cover your coughs and sneezes Cover your mouth and nose with a tissue when you cough or sneeze. Throw away used tissues in a lined trash can. Immediately wash your hands with soap and water for at least 20 seconds. If soap and water are not available, clean your hands with an alcohol-based hand sanitizer that contains at least 60%  alcohol. Clean your hands often Wash your hands often with soap and water for at least 20 seconds. This is especially important after blowing your nose, coughing, or sneezing; going to the bathroom; and before eating or preparing food. Use hand sanitizer if soap and water are not available. Use an alcohol-based hand sanitizer with at least 60% alcohol, covering all surfaces of your hands and rubbing them together until they feel dry. Soap and water are the best option, especially if hands are visibly dirty. Avoid touching your eyes, nose, and mouth with unwashed  hands. Handwashing Tips Avoid sharing personal household items Do not share dishes, drinking glasses, cups, eating utensils, towels, or bedding with other people in your home. Wash these items thoroughly after using them with soap and water or put in the dishwasher. Clean surfaces in your home regularly Clean and disinfect high-touch surfaces (for example, doorknobs, tables, handles, light switches, and countertops) in your sick room and bathroom. In shared spaces, you should clean and disinfect surfaces and items after each use by the person who is ill. If you are sick and cannot clean, a caregiver or other person should only clean and disinfect the area around you (such as your bedroom and bathroom) on an as needed basis. Your caregiver/other person should wait as long as possible (at least several hours) and wear a mask before entering, cleaning, and disinfecting shared spaces that you use. Clean and disinfect areas that may have blood, stool, or body fluids on them. Use household cleaners and disinfectants. Clean visible dirty surfaces with household cleaners containing soap or detergent. Then, use a household disinfectant. Use a product from [employer] List N: Disinfectants for Coronavirus (COVID-19). Be sure to follow the instructions on the label to ensure safe and effective use of the product. Many products recommend keeping the surface  wet with a disinfectant for a certain period of time (look at contact time on the product label). You may also need to wear personal protective equipment, such as gloves, depending on the directions on the product label. Immediately after disinfecting, wash your hands with soap and water for 20 seconds. For completed guidance on cleaning and disinfecting your home, visit Complete Disinfection Guidance. Take steps to improve ventilation at home Improve ventilation (air flow) at home to help prevent from spreading COVID-19 to other people in your household. Clear out COVID-19 virus particles in the air by opening windows, using air filters, and turning on fans in your home. Use this interactive tool to learn how to improve air flow in your home. When you can be around others after being sick with COVID-19 Deciding when you can be around others is different for different situations. Find out when you can safely end home isolation. For any additional questions about your care, contact your healthcare provider or state or local health department. 10/08/2020 Content source: [facility] for Immunization and Respiratory Diseases ([facility]), Division of Viral Diseases This information is not intended to replace advice given to you by your health care provider. Make sure you discuss any questions you have with your health care provider. Document Revised: 11/21/2020 Document Reviewed: 11/21/2020 Elsevier Patient Education  2022 [employer].     If you have been instructed to have an in-person evaluation today at a local Urgent Care facility, please use the link below. It will take you to a list of all of our available [facility], including address, phone number and hours of operation. Please do not delay care.  [facility]  If you or a family member do not have a primary care provider, use the link below to schedule a visit and establish care. When you choose a [facility]  primary care physician or advanced practice provider, you gain a long-term partner in health. Find a Primary Care Provider  Learn more about [facility]'s in-office and virtual care options: [facility] - Get Care Now

## 2024-03-28 ENCOUNTER — Encounter: Payer: Self-pay | Admitting: Physician Assistant

## 2024-03-28 MED ORDER — BENZONATATE 100 MG PO CAPS: 100.0000 mg | ORAL_CAPSULE | Freq: Three times a day (TID) | ORAL | 0 refills | Status: DC | PRN

## 2024-03-28 NOTE — Addendum Note (Signed)
 Addended by: MOISHE CHIQUITA HERO on: 03/28/2024 02:33 PM   Modules accepted: Orders

## 2024-03-30 ENCOUNTER — Ambulatory Visit: Admitting: Nurse Practitioner

## 2024-04-01 ENCOUNTER — Other Ambulatory Visit: Payer: Self-pay | Admitting: Nurse Practitioner

## 2024-04-01 DIAGNOSIS — I10 Essential (primary) hypertension: Secondary | ICD-10-CM

## 2024-04-10 ENCOUNTER — Encounter (INDEPENDENT_AMBULATORY_CARE_PROVIDER_SITE_OTHER): Payer: Self-pay

## 2024-04-13 ENCOUNTER — Encounter (INDEPENDENT_AMBULATORY_CARE_PROVIDER_SITE_OTHER): Payer: Self-pay | Admitting: Family Medicine

## 2024-04-13 ENCOUNTER — Ambulatory Visit (INDEPENDENT_AMBULATORY_CARE_PROVIDER_SITE_OTHER): Admitting: Family Medicine

## 2024-04-13 VITALS — BP 138/88 | HR 85 | Temp 97.8°F | Ht 64.0 in | Wt 214.0 lb

## 2024-04-13 DIAGNOSIS — R0602 Shortness of breath: Secondary | ICD-10-CM | POA: Diagnosis not present

## 2024-04-13 DIAGNOSIS — Z1331 Encounter for screening for depression: Secondary | ICD-10-CM | POA: Diagnosis not present

## 2024-04-13 DIAGNOSIS — I10 Essential (primary) hypertension: Secondary | ICD-10-CM | POA: Diagnosis not present

## 2024-04-13 DIAGNOSIS — G4733 Obstructive sleep apnea (adult) (pediatric): Secondary | ICD-10-CM | POA: Diagnosis not present

## 2024-04-13 DIAGNOSIS — E781 Pure hyperglyceridemia: Secondary | ICD-10-CM

## 2024-04-13 DIAGNOSIS — R7303 Prediabetes: Secondary | ICD-10-CM | POA: Diagnosis not present

## 2024-04-13 DIAGNOSIS — R5383 Other fatigue: Secondary | ICD-10-CM

## 2024-04-13 DIAGNOSIS — Z6836 Body mass index (BMI) 36.0-36.9, adult: Secondary | ICD-10-CM

## 2024-04-13 DIAGNOSIS — E559 Vitamin D deficiency, unspecified: Secondary | ICD-10-CM

## 2024-04-13 DIAGNOSIS — E669 Obesity, unspecified: Secondary | ICD-10-CM

## 2024-04-13 NOTE — Progress Notes (Signed)
 Diane Townsend, D.O.  ABFM, ABOM Specializing in Clinical Bariatric Medicine Office located at: [address]  [place], [place]  [address]   Bariatric Medicine Visit  Dear Diane Townsend, Lynwood HERO, NP   Thank you for referring Diane Townsend to our clinic today for evaluation.  We performed a consultation to discuss her options for treatment and educate the patient on her disease state.  The following note includes my evaluation and treatment recommendations.   Please do not hesitate to reach out to me directly if you have any further concerns.    Assessment and Plan:   Orders Placed This Encounter  Procedures   Lipid panel   Hemoglobin A1c   Comprehensive metabolic panel with GFR   CBC with Differential/Platelet   Vitamin B12   VITAMIN D  25 Hydroxy (Vit-D Deficiency, Fractures)   T4, free   TSH   Insulin , random   Folate   EKG 12-Lead       Obtain labs today and review at next OV.  FOR THE DISEASE OF OBESITY:  Obesity (BMI 35.0-39.9 without comorbidity) start BMI 36.72 Obesity (BMI 30-39.9) current BMI 36.72 Assessment & Plan: Ulyssa is currently in the action stage of change. As such, her goal is to start our weight management plan.  She has agreed to implement: Cat 1 MP w Lunch Options    Behavioral Intervention We discussed the following Behavioral Modification Strategies today: increasing lean protein intake to established goals, avoiding skipping meals, increasing water intake , and work on managing stress, creating time for self-care and relaxation  Additional resources provided today: Handout on CAT 1 meal plan  and Handout on CAT 1-2 lunch options, Handout on Grocery List   Evidence-based interventions for health behavior change were utilized today including the discussion of self monitoring techniques, problem-solving barriers and SMART goal setting techniques.    Goal(s) for next OV: weigh vegetables and lean proteins     Recommended Physical Activity  Goals Demiyah has been advised to work up to 300-450  minutes of moderate intensity aerobic activity a week and strengthening exercises 2-3 times per week for cardiovascular health, weight loss maintenance and preservation of muscle mass.   She has agreed to : maintain current level of activity.    Pharmacotherapy We discussed various medication options to help Roux with her weight loss efforts and we both agreed to: Begin with current nutritional and behavioral strategies.  Pt was previously on Wegovy  but discontinued use due to insurance no longer covering the cost.    ASSOCIATED CONDITIONS ADDRESSED TODAY:   Fatigue Assessment & Plan: Lynell does feel that her weight is causing her energy to be lower than it should be. Fatigue may be related to obesity, depression or many other causes. she does not appear to have any red flag symptoms and this appears to most likely be related to her current lifestyle habits and dietary intake.  Labs will be ordered and reviewed with her at their next office visit in two weeks.  Epworth sleepiness scale is 5 and appears to be within normal limits. Simrit denies daytime somnolence and admits to waking up still tired. Patient has a history of OSA Chari generally gets 8 or 9 hours of sleep per night, and states that she has generally restful sleep. Snoring is present. Apneic episodes are present.   ECG: Performed and reviewed/ interpreted independently.  Normal sinus rhythm, rate 72 bpm; reassuring without any acute abnormalities, will continue to monitor for symptoms  Shortness of breath on exertion Assessment & Plan: Bevelyn does feel that she gets out of breath more easily than she used to when she exercises and seems to be worsening over time with weight gain.  This has gotten worse recently. Jaynie denies shortness of breath at rest or orthopnea. Pt denies chest pain, dizziness, heart palpitations, or excessive diaphoresis or nausea with  activity.  This is not new and is ongoing.  Anisa's shortness of breath appears to be obesity related and exercise induced, as they do not appear to have any red flag symptoms/ concerns today.  Also, this condition appears to be related to a state of poor cardiovascular conditioning   Obtain labs today and will be reviewed with her at their next office visit in two weeks.  Indirect Calorimeter completed today to help guide our dietary regimen. It shows a VO2 of 220 and a REE of 1512.  Her calculated basal metabolic rate is 8305 thus her resting energy expenditure is worse than expected.  Patient agreed to work on weight loss at this time.  As Karlissa progresses through our weight loss program, we will gradually increase exercise as tolerated to treat her current condition.   If Lujean follows our recommendations and loses 5-10% of their weight without improvement of her shortness of breath or if at any time, symptoms become more concerning, they agree to urgently follow up with their PCP/ specialist for further consideration/ evaluation.   Vasilisa verbalizes agreement with this plan.     Depression Screen  Assessment & Plan: Her PHQ-9 score was 1 today.    Severe obstructive sleep apnea-hypopnea syndrome  Assessment & Plan Pt has been using CPAP machine since 2009 with good compliance and tolerance. She regularly checks her appliance every year. Pt has previously been advised that Zepbound would be beneficial for her by her sleep specialist Dr.Walsh. Was prescribed Zepbound but insurance denied the prescription and will not cover Zepbound due to not having all information and step therapy needed. Advised pt to write down every step that insurance is asking to get Zepbound covered. Continue CPAP usage. Will continue monitoring alongside PCP/specialists.     Prediabetes Assessment & Plan Lab Results  Component Value Date   HGBA1C 5.9 06/07/2023   HGBA1C 5.7 06/03/2022   HGBA1C 6.1  06/02/2021    Diet and lifestyle intervention controlled. Pt states that she does not have excess hunger and has good control on cravings when she is not stressed. Begin nutritional meal plan. Will obtain labs and review at next OV.     Hypertriglyceridemia Assessment & Plan Lab Results  Component Value Date   CHOL 180 06/07/2023   HDL 47.60 06/07/2023   LDLCALC 85 06/07/2023   LDLDIRECT 74.0 06/03/2022   TRIG 239.0 (H) 06/07/2023   CHOLHDL 4 06/07/2023   Pt states that she is not on any medications. Lifestyle and diet controlled. Cardiovascular risk and specific lipid/LDL goals reviewed. Begin following prudent meal plan and increasing exercise as tolerated. Will obtain lipid panel today and review at next OV.     Essential hypertension Assessment & Plan BP Readings from Last 3 Encounters:  04/13/24 138/88  02/18/24 136/72  02/09/24 138/86   The 10-year ASCVD risk score (Arnett DK, et al., 2019) is: 4.9%  Lab Results  Component Value Date   CREATININE 0.86 02/18/2024   On amlodipine  10 mg daily. Poorly controlled. BP was high on first take but was retaken and was a little lower on second BP  reading. Pt states that this could be because she had not taken her medication today because she was unaware she could take them before coming in today.     Vitamin D  deficiency Assessment & Plan Lab Results  Component Value Date   VD25OH 36.9 08/26/2017   Pt states that she is taking Vit D 5000 units daily. With good compliance and tolerance. Previous labs show Vit D not at goal. Will continue supplementation. Will obtain labs and review at next OV.    FOLLOW UP:   Follow up in 2 weeks. She was informed of the importance of frequent follow up visits to maximize her success with intensive lifestyle modifications for her multiple health conditions.  Diane Townsend is aware that we will review all of her lab results at our next visit.  She is aware that if anything is critical/  life threatening with the results, we will be contacting her via MyChart prior to the office visit to discuss management.     Chief Complaint:   OBESITY Lynlee Stratton (MR# [id number]) is a pleasant 48 y.o. female who presents for evaluation and treatment of obesity and related comorbidities. Current BMI is Body mass index is 36.73 kg/m. Diane Townsend has been struggling with her weight for many years and has been unsuccessful in either losing weight, maintaining weight loss, or reaching her healthy weight goal.  Diane Townsend is currently in the action stage of change and ready to dedicate time achieving and maintaining a healthier weight. Rhealynn Myhre is interested in becoming our patient and working on intensive lifestyle modifications including (but not limited to) diet and exercise for weight loss.  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 Patient is widowed and has children. She lives alone.   - Lost her husband in 2023  - Pt has never smoked  -No formal exercise  - Has 2 children   -  Wants to be 89 in 6months to a year.  - She wants to lose weight to feel better and be around for her grand kids  -Heaviest weight is now    - Was 160 lbs when she was 19   - Never tried any diets  - Has previously been on phentermine and lost 10 lbs and Phenphen and lost 40 lbs    - Did not keep the weight off   - Does not know what works the best   -Craves shrimp, flounder, steak and potatoes  - Considers herself a picky eater  - Does not snack frequently    - Snacks after dinner on chips and candy   - Skips breakfast 3 x week on average   - Drinks regular soda rarely, cocktails 2 x week   - Worst Food Habit is bread and potatoes  - Eats quickly in front of the TV or computer  - Eats when stressed and sad  - Under a lot of stress  - Has not had bariatric surgery but has considered it   Subjective:   This is the patient's first visit at [facility].  The patient's NEW PATIENT PACKET that they filled out prior to today's office visit was reviewed at length and information from that paperwork was included within the following office visit note.    Included in the packet: current and past health history, medications, allergies, ROS, gynecologic history (women only), surgical history, family history, social history, weight history, weight loss surgery history (for those that  have had weight loss surgery), nutritional evaluation, mood and food questionnaire along with a depression screening (PHQ9) on all patients, an Epworth questionnaire, sleep habits questionnaire, patient life and health improvement goals questionnaire. These will all be scanned into the patient's chart under the media tab.   Review of Systems: Please refer to new patient packet scanned into media. Pertinent positives were addressed with patient today.  Reviewed by clinician on day of visit: allergies, medications, problem list, medical history, surgical history, family history, social history, and previous encounter notes.  During the visit, I independently reviewed the patient's EKG, bioimpedance scale results, and indirect calorimeter results. I used this information to tailor a meal plan for the patient that will help Diane Townsend to lose weight and will improve her obesity-related conditions going forward.  I performed a medically necessary appropriate examination and/or evaluation. I discussed the assessment and treatment plan with the patient. The patient was provided an opportunity to ask questions and all were answered. The patient agreed with the plan and demonstrated an understanding of the instructions. Labs were ordered today (unless patient declined them) and will be reviewed with the patient at our next visit unless more critical results need to be addressed immediately. Clinical information was updated and documented in the EMR.    Objective:    PHYSICAL EXAM: Blood pressure 138/88, pulse 85, temperature 97.8 F (36.6 C), height 5' 4 (1.626 m), weight 214 lb (97.1 kg), SpO2 98%. Body mass index is 36.73 kg/m.  General: Well Developed, well nourished, and in no acute distress.  HEENT: Normocephalic, atraumatic; EOMI, sclerae are anicteric. Skin: Warm and dry, good turgor Chest:  Normal excursion, shape, no gross ABN Respiratory: No conversational dyspnea; speaking in full sentences NeuroM-Sk:  Normal gross ROM * 4 extremities  Psych: A and O *3, insight adequate, mood- full   Anthropometric Measurements Height: 5' 4 (1.626 m) Weight: 214 lb (97.1 kg) BMI (Calculated): 36.72 Weight at Last Visit: NA Weight Lost Since Last Visit: 0lb Weight Gained Since Last Visit: 0lb Starting Weight: 214lb Total Weight Loss (lbs): 0 lb (0 kg) Peak Weight: 220lb Waist Measurement : 41.5 inches   Body Composition  Body Fat %: 43.2 % Fat Mass (lbs): 92.6 lbs Muscle Mass (lbs): 115.4 lbs Total Body Water (lbs): 77.8 lbs Visceral Fat Rating : 12   Other Clinical Data RMR: 1512 Fasting: Yes Labs: Yes Today's Visit #: 1 Starting Date: 04/13/24 Comments: New Patient    DIAGNOSTIC DATA REVIEWED:  BMET    Component Value Date/Time   NA 138 02/18/2024 0941   NA 142 12/01/2019 1459   K 3.6 02/18/2024 0941   CL 102 02/18/2024 0941   CO2 30 02/18/2024 0941   GLUCOSE 96 02/18/2024 0941   BUN 10 02/18/2024 0941   BUN 12 12/01/2019 1459   CREATININE 0.86 02/18/2024 0941   CALCIUM 9.2 02/18/2024 0941   GFRNONAA >60 04/04/2022 2031   GFRAA 102 12/01/2019 1459   Lab Results  Component Value Date   HGBA1C 5.9 06/07/2023   HGBA1C 5.7 (H) 08/26/2017   No results found for: INSULIN  Lab Results  Component Value Date   TSH 1.62 06/07/2023   CBC    Component Value Date/Time   WBC 6.9 02/18/2024 0941   RBC 4.92 02/18/2024 0941   HGB 13.1 02/18/2024 0941   HGB 13.8 12/01/2019 1459   HCT 39.7 02/18/2024 0941   HCT  41.8 12/01/2019 1459   PLT 323.0 02/18/2024 0941   PLT 290 12/01/2019  1459   MCV 80.7 02/18/2024 0941   MCV 81 12/01/2019 1459   MCH 27.0 04/04/2022 2031   MCHC 33.0 02/18/2024 0941   RDW 13.9 02/18/2024 0941   RDW 13.6 12/01/2019 1459   Iron Studies No results found for: IRON, TIBC, FERRITIN, IRONPCTSAT Lipid Panel     Component Value Date/Time   CHOL 180 06/07/2023 1626   CHOL 138 08/26/2017 0820   TRIG 239.0 (H) 06/07/2023 1626   HDL 47.60 06/07/2023 1626   HDL 44 08/26/2017 0820   CHOLHDL 4 06/07/2023 1626   VLDL 47.8 (H) 06/07/2023 1626   LDLCALC 85 06/07/2023 1626   LDLCALC 57 08/26/2017 0820   LDLDIRECT 74.0 06/03/2022 1643   Hepatic Function Panel     Component Value Date/Time   PROT 7.7 02/18/2024 0941   PROT 7.6 12/01/2019 1459   ALBUMIN 4.1 02/18/2024 0941   ALBUMIN 4.4 12/01/2019 1459   AST 21 02/18/2024 0941   ALT 21 02/18/2024 0941   ALKPHOS 54 02/18/2024 0941   BILITOT 0.3 02/18/2024 0941   BILITOT 0.4 12/01/2019 1459   BILIDIR 0.1 07/06/2022 0744      Component Value Date/Time   TSH 1.62 06/07/2023 1626   Nutritional Lab Results  Component Value Date   VD25OH 36.9 08/26/2017    Attestation Statements:   LILLETTE Sonny Laroche, acting as a Stage manager for Diane Jenkins, DO., have compiled all relevant documentation for today's office visit on behalf of Diane Jenkins, DO, while in the presence of [employer], DO.  I have spent 52 minutes in the care of the patient today including: 32 minutes face-to-face assessing and reviewing listed medical problems above as outlined in office visit note, providing nutritional and behavioral counseling as outlined in obesity care plan, independently interpreting results and goals of care, see listed medical problems, and discussing biometric information and progress. 20 minutes was spent on review of chart/new patient packet and additional post-visit documentation.  I have reviewed the above  documentation for accuracy and completeness, and I agree with the above. Diane JINNY Townsend, D.O.  The 21st Century Cures Act was signed into law in 2016 which includes the topic of electronic health records.  This provides immediate access to information in MyChart.  This includes consultation notes, operative notes, office notes, lab results and pathology reports.  If you have any questions about what you read please let us  know at your next visit so we can discuss your concerns and take corrective action if need be.  We are right here with you.

## 2024-04-14 LAB — TSH: TSH: 3.33 u[IU]/mL (ref 0.450–4.500)

## 2024-04-14 LAB — COMPREHENSIVE METABOLIC PANEL WITH GFR
ALT: 40 IU/L — ABNORMAL HIGH (ref 0–32)
AST: 28 IU/L (ref 0–40)
Albumin: 4.5 g/dL (ref 3.9–4.9)
Alkaline Phosphatase: 60 IU/L (ref 41–116)
BUN/Creatinine Ratio: 12 (ref 9–23)
BUN: 10 mg/dL (ref 6–24)
Bilirubin Total: 0.3 mg/dL (ref 0.0–1.2)
CO2: 22 mmol/L (ref 20–29)
Calcium: 10.1 mg/dL (ref 8.7–10.2)
Chloride: 100 mmol/L (ref 96–106)
Creatinine, Ser: 0.82 mg/dL (ref 0.57–1.00)
Globulin, Total: 3.2 g/dL (ref 1.5–4.5)
Glucose: 108 mg/dL — ABNORMAL HIGH (ref 70–99)
Potassium: 4.3 mmol/L (ref 3.5–5.2)
Sodium: 140 mmol/L (ref 134–144)
Total Protein: 7.7 g/dL (ref 6.0–8.5)
eGFR: 88 mL/min/1.73 (ref >59)

## 2024-04-14 LAB — CBC WITH DIFFERENTIAL/PLATELET
Basophils Absolute: 0 x10E3/uL (ref 0.0–0.2)
Basos: 1 %
EOS (ABSOLUTE): 0.2 x10E3/uL (ref 0.0–0.4)
Eos: 3 %
Hematocrit: 42.4 % (ref 34.0–46.6)
Hemoglobin: 13.7 g/dL (ref 11.1–15.9)
Immature Grans (Abs): 0 x10E3/uL (ref 0.0–0.1)
Immature Granulocytes: 0 %
Lymphocytes Absolute: 2.3 x10E3/uL (ref 0.7–3.1)
Lymphs: 40 %
MCH: 27 pg (ref 26.6–33.0)
MCHC: 32.3 g/dL (ref 31.5–35.7)
MCV: 84 fL (ref 79–97)
Monocytes Absolute: 0.5 x10E3/uL (ref 0.1–0.9)
Monocytes: 9 %
Neutrophils Absolute: 2.7 x10E3/uL (ref 1.4–7.0)
Neutrophils: 47 %
Platelets: 311 x10E3/uL (ref 150–450)
RBC: 5.08 x10E6/uL (ref 3.77–5.28)
RDW: 14.2 % (ref 11.7–15.4)
WBC: 5.7 x10E3/uL (ref 3.4–10.8)

## 2024-04-14 LAB — LIPID PANEL
Chol/HDL Ratio: 3.2 ratio (ref 0.0–4.4)
Cholesterol, Total: 168 mg/dL (ref 100–199)
HDL: 52 mg/dL (ref >39)
LDL Chol Calc (NIH): 87 mg/dL (ref 0–99)
Triglycerides: 168 mg/dL — ABNORMAL HIGH (ref 0–149)
VLDL Cholesterol Cal: 29 mg/dL (ref 5–40)

## 2024-04-14 LAB — VITAMIN B12: Vitamin B-12: 926 pg/mL (ref 232–1245)

## 2024-04-14 LAB — HEMOGLOBIN A1C
Est. average glucose Bld gHb Est-mCnc: 131 mg/dL
Hgb A1c MFr Bld: 6.2 % — ABNORMAL HIGH (ref 4.8–5.6)

## 2024-04-14 LAB — T4, FREE: Free T4: 1.26 ng/dL (ref 0.82–1.77)

## 2024-04-14 LAB — FOLATE: Folate: 3 ng/mL — ABNORMAL LOW (ref >3.0)

## 2024-04-14 LAB — INSULIN, RANDOM: INSULIN: 43.8 u[IU]/mL — ABNORMAL HIGH (ref 2.6–24.9)

## 2024-04-14 LAB — VITAMIN D 25 HYDROXY (VIT D DEFICIENCY, FRACTURES): Vit D, 25-Hydroxy: 77 ng/mL (ref 30.0–100.0)

## 2024-04-27 ENCOUNTER — Ambulatory Visit (INDEPENDENT_AMBULATORY_CARE_PROVIDER_SITE_OTHER): Admitting: Family Medicine

## 2024-04-27 ENCOUNTER — Encounter (INDEPENDENT_AMBULATORY_CARE_PROVIDER_SITE_OTHER): Payer: Self-pay | Admitting: Family Medicine

## 2024-04-27 VITALS — BP 144/86 | HR 80 | Temp 97.6°F | Ht 64.0 in | Wt 211.0 lb

## 2024-04-27 DIAGNOSIS — E66812 Obesity, class 2: Secondary | ICD-10-CM

## 2024-04-27 DIAGNOSIS — E559 Vitamin D deficiency, unspecified: Secondary | ICD-10-CM

## 2024-04-27 DIAGNOSIS — R7303 Prediabetes: Secondary | ICD-10-CM | POA: Diagnosis not present

## 2024-04-27 DIAGNOSIS — G4733 Obstructive sleep apnea (adult) (pediatric): Secondary | ICD-10-CM

## 2024-04-27 DIAGNOSIS — Z6836 Body mass index (BMI) 36.0-36.9, adult: Secondary | ICD-10-CM

## 2024-04-27 DIAGNOSIS — I10 Essential (primary) hypertension: Secondary | ICD-10-CM

## 2024-04-27 DIAGNOSIS — K760 Fatty (change of) liver, not elsewhere classified: Secondary | ICD-10-CM

## 2024-04-27 DIAGNOSIS — E538 Deficiency of other specified B group vitamins: Secondary | ICD-10-CM

## 2024-04-27 DIAGNOSIS — E781 Pure hyperglyceridemia: Secondary | ICD-10-CM

## 2024-04-27 MED ORDER — FOLIC ACID 800 MCG PO TABS: 800.0000 ug | ORAL_TABLET | Freq: Every day | ORAL | Status: AC

## 2024-04-27 NOTE — Progress Notes (Signed)
 Barnie DOROTHA Jenkins, D.O.  ABFM, ABOM Clinical Bariatric Medicine Physician  Office located at: [address] [place], [place]  [address]    FOR THE CHRONIC DISEASE OF OBESITY:  Chief complaint: Obesity Diane Townsend is here to discuss her progress with her obesity treatment plan.   History of present illness / Interval history:  Diane Townsend is here today for her first follow-up office visit since starting the program with us . Patient is off to a good start and has lost 3 lbs. 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 She endorses using her snack calories with yogurt and berried and ritz crackers. She states that she is eating late and then at the end of the night finds herself cramming her food in. She is drinking plenty of water.       04/13/24 04/27/24   Body Fat % 43.2 % 37.4 %  Muscle Mass (lbs) 115.4 lbs 126 lbs  Fat Mass (lbs) 92.6 lbs 79.2 lbs  Total Body Water (lbs) 77.8 lbs 79.4 lbs  Visceral Fat Rating  12 10    Counseling done today with patient on how various foods will affect these numbers and how to maximize fat loss success   Total lbs lost to date: - 3 lbs Total Fat Mass in lbs lost to date: - 1.8 lbs Total weight loss percentage to date: - 1.40 %   Nutrition Therapy She is on the Category 1 MP w Lunch options and states she is following her eating plan approximately 70 % of the time.   - Tracking Calories/Macros: no   - Eating More Whole Foods: yes  - Adequate Protein Intake: yes  - Adequate Water Intake: yes  - Skipping Meals: no   - Sleeping 7-9 Hours/ Night: yes  Diane Townsend is currently in the action stage of change. As such, her goal is to continue weight management plan.  She has agreed to: continue current plan   Physical Activity Pt is doing the elliptical and gym machines 45-60  minutes 5 days per week   Layton has been advised to work up to 300-450  minutes of moderate intensity aerobic activity a week and strengthening exercises 2-3 times per week for cardiovascular health, weight loss maintenance and preservation of muscle mass.   She has agreed to : Think about enjoyable ways to increase daily physical activity and overcoming barriers to exercise, Increase physical activity in their day and reduce sedentary time (increase NEAT)., and Combine aerobic and strengthening exercises for efficiency and improved cardiometabolic health.   Behavioral Modifications Evidence-based interventions for health behavior change were utilized today including the discussion of  1) self monitoring techniques:    2) problem-solving barriers:    - Create supportive environment   3) self care:    4) SMART goals for next OV:    Regarding patient's less desirable eating habits and patterns, we employed the technique of small changes.   We discussed the following today: increasing lean protein intake to established goals, increasing water intake , and better snacking choices - moving food and protein to different times  - graze throughtout the day  Additional resources provided today: Handout on Pre-Diabetes education  and Handout on insulin  resistance education   Medical Interventions/ Pharmacotherapy Previous Bariatric surgery:  Pharmacotherapy for weight loss: She is not currently taking medications  for medical weight loss.    We discussed  various medication options to help Diane Townsend with her weight loss efforts and we both agreed to : Continue with current nutritional and behavioral strategies   OBESITY RELATED CONDITIONS ADDRESSED TODAY: Extensive review of labs performed today that were drawn at the new patient office visit on 04/13/24.   Prediabetes Assessment & Plan Lab Results  Component Value Date   HGBA1C 6.2 (H) 04/13/2024   HGBA1C 5.9 06/07/2023   HGBA1C 5.7 06/03/2022   INSULIN  43.8 (H) 04/13/2024   CMP     Component Value  Date/Time   NA 140 04/13/2024 0941   K 4.3 04/13/2024 0941   CL 100 04/13/2024 0941   CO2 22 04/13/2024 0941   GLUCOSE 108 (H) 04/13/2024 0941   GLUCOSE 96 02/18/2024 0941   BUN 10 04/13/2024 0941   CREATININE 0.82 04/13/2024 0941   CALCIUM 10.1 04/13/2024 0941   PROT 7.7 04/13/2024 0941   ALBUMIN 4.5 04/13/2024 0941   AST 28 04/13/2024 0941   ALT 40 (H) 04/13/2024 0941   ALKPHOS 60 04/13/2024 0941   BILITOT 0.3 04/13/2024 0941   GFR 79.52 02/18/2024 0941   EGFR 88 04/13/2024 0941   GFRNONAA >60 04/04/2022 2031  CBC    Component Value Date/Time   WBC 5.7 04/13/2024 0941   WBC 6.9 02/18/2024 0941   RBC 5.08 04/13/2024 0941   RBC 4.92 02/18/2024 0941   HGB 13.7 04/13/2024 0941   HCT 42.4 04/13/2024 0941   PLT 311 04/13/2024 0941   MCV 84 04/13/2024 0941   MCH 27.0 04/13/2024 0941   MCH 27.0 04/04/2022 2031   MCHC 32.3 04/13/2024 0941   MCHC 33.0 02/18/2024 0941   RDW 14.2 04/13/2024 0941   LYMPHSABS 2.3 04/13/2024 0941   MONOABS 0.5 02/18/2024 0941   EOSABS 0.2 04/13/2024 0941   BASOSABS 0.0 04/13/2024 0941   Lab Results  Component Value Date   TSH 3.330 04/13/2024   FREET4 1.26 04/13/2024   Pts A1c has increased from 5.9 to 6.2. Insulin  is not at goal greater than 5. Pt states that she has control over her hunger and cravings. Answered any pertinent questions. Continue to decrease simple carbs/ sugars; increase fiber and proteins. Explained role of simple carbs and insulin  levels on hunger and cravings All labs are WNL.    Dietary folate deficiency Assessment & Plan Lab Results  Component Value Date   VITAMINB12 926 04/13/2024   Lab Results  Component Value Date   FOLATE 3.0 (L) 04/13/2024   Pts B12 is at goal. Folate levels are not at goal. Low levels of folate can lead to neurological dysfunction and this can also lead to pt feeling sluggish and tired. Recommended pt to double folic acid to 800 mcg daily. Will recheck levels in the next 3-4 months.       Hypertriglyceridemia Assessment & Plan Lab Results  Component Value Date   CHOL 168 04/13/2024   HDL 52 04/13/2024   LDLCALC 87 04/13/2024   LDLDIRECT 74.0 06/03/2022   TRIG 168 (H) 04/13/2024   CHOLHDL 3.2 04/13/2024    Pt is not currently on any medication. Diet and lifestyle controlled. Pts cholesterol level has decreased. HDL is at goal and the best it has been in awhile. Levels will continue to increase as pt exercises. LDL levels are at goal. Trig levels have decreased from last labs to 168. Continue following prudent meal plan, decreasing foods high in saturated and trans fat and increasing exercise.      Vitamin D  deficiency Assessment &  Plan Lab Results  Component Value Date   VD25OH 77.0 04/13/2024   VD25OH 36.9 08/26/2017   Takes OTC Vit D  5000 lUs daily good compliance and tolerance.Pts vit d levels are at goal. Since we are going into winter recommended pt not to completely discontinue use but to change to 5 days a week and skip the weekends. Will reassess in 3 to 4 months.     Severe obstructive sleep apnea-hypopnea syndrome Assessment & Plan Pt has been using CPAP machine nightly. Good compliance and tolerance. She states that it has been well controlled. Pt states that she was denied for Zepbound coverage even with her severe case of OSA. Recommended pt to call insurance once again and appeal Zepbound denial. Will continue monitoring alongside PCP.      Essential hypertension- not at goal Assessment & Plan BP Readings from Last 3 Encounters:  04/27/24 (!) 144/86  04/13/24 138/88  02/18/24 136/72   The 10-year ASCVD risk score (Arnett DK, et al., 2019) is: 4.8%  Lab Results  Component Value Date   CREATININE 0.82 04/13/2024   On amlodipine  10 mg daily. Good compliance and tolerance. BP is not at goal today, poorly controlled. BP is high today. Pt states that this could be due to frustration of work. Pt endorses checking her BP at home and it ranging  from 130-140/84-89. Recommended pt to call PCP and avoid buying foods that are: processed, frozen, or prepackaged to avoid excess salt. Increase water intake (half of body weight in oz)      Metabolic dysfunction-associated steatotic liver disease (MASLD) Assessment & Plan Hepatic Function Panel     Component Value Date/Time   PROT 7.7 04/13/2024 0941   ALBUMIN 4.5 04/13/2024 0941   AST 28 04/13/2024 0941   ALT 40 (H) 04/13/2024 0941   ALKPHOS 60 04/13/2024 0941   BILITOT 0.3 04/13/2024 0941   BILIDIR 0.1 07/06/2022 0744   Pts current visceral fat rating is <10. Goal visceral fat rating is below 12. Pts live enzymes show elevation. CT show no focal liver abnormality. ALT is higher than AST which can lead to fatty liver. Continue weight loss and following prudent meal plan.     Medications Discontinued During This Encounter  Medication Reason   fluticasone  (FLONASE ) 50 MCG/ACT nasal spray No longer needed (for PRN medications)   ondansetron  (ZOFRAN -ODT) 4 MG disintegrating tablet No longer needed (for PRN medications)   promethazine -dextromethorphan (PROMETHAZINE -DM) 6.25-15 MG/5ML syrup No longer needed (for PRN medications)     Meds ordered this encounter  Medications   folic acid (FOLVITE) 800 MCG tablet    Sig: Take 1 tablet (800 mcg total) by mouth daily.     FOLLOW UP:   Return 05/18/2024 at 4:00 PM  She was informed of the importance of frequent follow up visits to maximize her success with intensive lifestyle modifications for her multiple health conditions.   Weight Summary and Biometrics   Weight Lost Since Last Visit: 3  Weight Gained Since Last Visit: 0   Vitals Temp: 97.6 F (36.4 C) BP: (!) 144/86 Pulse Rate: 80 SpO2: 100 %   Anthropometric Measurements Height: 5' 4 (1.626 m) Weight: 211 lb (95.7 kg) BMI (Calculated): 36.2 Weight at Last Visit: 214 lb Weight Lost Since Last Visit: 3 Weight Gained Since Last Visit: 0 Starting Weight: 214  lb Total Weight Loss (lbs): 3 lb (1.361 kg) Peak Weight: 220 lb   Body Composition  Body Fat %: 37.4 % Fat Mass (lbs):  79.2 lbs Muscle Mass (lbs): 126 lbs Total Body Water (lbs): 79.4 lbs Visceral Fat Rating : 10   Other Clinical Data Today's Visit #: 2 Starting Date: 04/13/24     Objective:  Review of Systems:  Pertinent positives were addressed with patient today.   Reviewed by clinician on day of visit: allergies, medications, problem list, medical history, surgical history, family history, social history, and previous encounter notes.  PHYSICAL EXAM: Blood pressure (!) 144/86, pulse 80, temperature 97.6 F (36.4 C), height 5' 4 (1.626 m), weight 211 lb (95.7 kg), SpO2 100%. Body mass index is 36.22 kg/m. General: she is overweight, cooperative and in no acute distress.   HEENT: EOMI, sclerae are anicteric. Lungs: Normal breathing effort, no conversational dyspnea. M-Sk:  Normal gross ROM * 4 extremities  PSYCH: Has normal mood, affect and thought process. Neurologic: No gross sensory or motor deficits. Well developed, A and O * 3  DIAGNOSTIC DATA REVIEWED:  BMET    Component Value Date/Time   NA 140 04/13/2024 0941   K 4.3 04/13/2024 0941   CL 100 04/13/2024 0941   CO2 22 04/13/2024 0941   GLUCOSE 108 (H) 04/13/2024 0941   GLUCOSE 96 02/18/2024 0941   BUN 10 04/13/2024 0941   CREATININE 0.82 04/13/2024 0941   CALCIUM 10.1 04/13/2024 0941   GFRNONAA >60 04/04/2022 2031   GFRAA 102 12/01/2019 1459   Lab Results  Component Value Date   HGBA1C 6.2 (H) 04/13/2024   HGBA1C 5.7 (H) 08/26/2017   Lab Results  Component Value Date   INSULIN  43.8 (H) 04/13/2024   Lab Results  Component Value Date   TSH 3.330 04/13/2024   CBC    Component Value Date/Time   WBC 5.7 04/13/2024 0941   WBC 6.9 02/18/2024 0941   RBC 5.08 04/13/2024 0941   RBC 4.92 02/18/2024 0941   HGB 13.7 04/13/2024 0941   HCT 42.4 04/13/2024 0941   PLT 311 04/13/2024 0941   MCV 84  04/13/2024 0941   MCH 27.0 04/13/2024 0941   MCH 27.0 04/04/2022 2031   MCHC 32.3 04/13/2024 0941   MCHC 33.0 02/18/2024 0941   RDW 14.2 04/13/2024 0941   Iron Studies No results found for: IRON, TIBC, FERRITIN, IRONPCTSAT Lipid Panel     Component Value Date/Time   CHOL 168 04/13/2024 0941   TRIG 168 (H) 04/13/2024 0941   HDL 52 04/13/2024 0941   CHOLHDL 3.2 04/13/2024 0941   CHOLHDL 4 06/07/2023 1626   VLDL 47.8 (H) 06/07/2023 1626   LDLCALC 87 04/13/2024 0941   LDLDIRECT 74.0 06/03/2022 1643   Hepatic Function Panel     Component Value Date/Time   PROT 7.7 04/13/2024 0941   ALBUMIN 4.5 04/13/2024 0941   AST 28 04/13/2024 0941   ALT 40 (H) 04/13/2024 0941   ALKPHOS 60 04/13/2024 0941   BILITOT 0.3 04/13/2024 0941   BILIDIR 0.1 07/06/2022 0744      Component Value Date/Time   TSH 3.330 04/13/2024 0941   Nutritional Lab Results  Component Value Date   VD25OH 77.0 04/13/2024   VD25OH 36.9 08/26/2017     Attestations:   LILLETTE Sonny Laroche, acting as a Stage manager for Barnie Jenkins, DO., have compiled all relevant documentation for today's office visit on behalf of Barnie Jenkins, DO, while in the presence of [employer], DO.  I have spent 57 minutes in the care of the patient today.  47 minutes was spent on face-to-face counseling and reviewing listed medical problems  above as outlined in office visit note, providing nutritional and behavioral counseling as outlined in obesity care plan, independently interpreting results and goals of care, see listed medical problems, and discussing biometric information and progress. We reviewed pts meal plan and discussed how the foods she's eating is affecting each one of her labs. Pt educated on why we want her to eat various foods in various amounts and has a better understanding of the nutritional plan because of this. All  questions were answered today. 10 minutes was spent on pre-chart review and additional  documentation after the visit.   I have reviewed the above documentation for accuracy and completeness, and I agree with the above. Barnie JINNY Jenkins, D.O.  The 21st Century Cures Act was signed into law in 2016 which includes the topic of electronic health records.  This provides immediate access to information in MyChart.  This includes consultation notes, operative notes, office notes, lab results and pathology reports.  If you have any questions about what you read please let us  know at your next visit so we can discuss your concerns and take corrective action if need be.  We are right here with you.

## 2024-05-18 ENCOUNTER — Ambulatory Visit (INDEPENDENT_AMBULATORY_CARE_PROVIDER_SITE_OTHER): Admitting: Adult Health

## 2024-05-22 ENCOUNTER — Telehealth: Admitting: Physician Assistant

## 2024-05-22 DIAGNOSIS — R059 Cough, unspecified: Secondary | ICD-10-CM | POA: Diagnosis not present

## 2024-05-22 MED ORDER — PROMETHAZINE-DM 6.25-15 MG/5ML PO SYRP: 5.0000 mL | ORAL_SOLUTION | Freq: Four times a day (QID) | ORAL | 0 refills | Status: AC | PRN

## 2024-05-22 MED ORDER — PREDNISONE 20 MG PO TABS: 20.0000 mg | ORAL_TABLET | Freq: Every day | ORAL | 0 refills | Status: AC

## 2024-05-22 NOTE — Progress Notes (Signed)
 We are sorry that you are not feeling well.  Here is how we plan to help!  Based on your presentation I believe you most likely have A cough due to a virus.  This is called viral bronchitis and is best treated by rest, plenty of fluids and control of the cough.  You may use Ibuprofen or Tylenol as directed to help your symptoms.   I have also prescribed a short course of steroids. I also prescribed a prescription strength cough syrup.  From your responses in the eVisit questionnaire you describe inflammation in the upper respiratory tract which is causing a significant cough.  This is commonly called Bronchitis and has four common causes:   Allergies Viral Infections Acid Reflux Bacterial Infection Allergies, viruses and acid reflux are treated by controlling symptoms or eliminating the cause. An example might be a cough caused by taking certain blood pressure medications. You stop the cough by changing the medication. Another example might be a cough caused by acid reflux. Controlling the reflux helps control the cough.  USE OF BRONCHODILATOR (RESCUE) INHALERS: There is a risk from using your bronchodilator too frequently.  The risk is that over-reliance on a medication which only relaxes the muscles surrounding the breathing tubes can reduce the effectiveness of medications prescribed to reduce swelling and congestion of the tubes themselves.  Although you feel brief relief from the bronchodilator inhaler, your asthma may actually be worsening with the tubes becoming more swollen and filled with mucus.  This can delay other crucial treatments, such as oral steroid medications. If you need to use a bronchodilator inhaler daily, several times per day, you should discuss this with your provider.  There are probably better treatments that could be used to keep your asthma under control.     HOME CARE Only take medications as instructed by your medical team. Complete the entire course of an  antibiotic. Drink plenty of fluids and get plenty of rest. Avoid close contacts especially the very young and the elderly Cover your mouth if you cough or cough into your sleeve. Always remember to wash your hands A steam or ultrasonic humidifier can help congestion.   GET HELP RIGHT AWAY IF: You develop worsening fever. You become short of breath You cough up blood. Your symptoms persist after you have completed your treatment plan MAKE SURE YOU  Understand these instructions. Will watch your condition. Will get help right away if you are not doing well or get worse.  Your e-visit answers were reviewed by a board certified advanced clinical practitioner to complete your personal care plan.  Depending on the condition, your plan could have included both over the counter or prescription medications. If there is a problem please reply  once you have received a response from your provider. Your safety is important to us .  If you have drug allergies check your prescription carefully.    You can use MyChart to ask questions about today's visit, request a non-urgent call back, or ask for a work or school excuse for 24 hours related to this e-Visit. If it has been greater than 24 hours you will need to follow up with your provider, or enter a new e-Visit to address those concerns. You will get an e-mail in the next two days asking about your experience.  I hope that your e-visit has been valuable and will speed your recovery. Thank you for using e-visits.   I have spent 5 minutes in review of e-visit questionnaire, review  and updating patient chart, medical decision making and response to patient.   Teena Shuck, PA-C

## 2024-05-29 ENCOUNTER — Ambulatory Visit (INDEPENDENT_AMBULATORY_CARE_PROVIDER_SITE_OTHER): Admitting: Adult Health

## 2024-05-29 VITALS — BP 143/85 | HR 66 | Temp 98.4°F | Ht 64.0 in | Wt 214.0 lb

## 2024-05-29 DIAGNOSIS — R7303 Prediabetes: Secondary | ICD-10-CM | POA: Diagnosis not present

## 2024-05-29 DIAGNOSIS — I10 Essential (primary) hypertension: Secondary | ICD-10-CM | POA: Diagnosis not present

## 2024-05-29 DIAGNOSIS — J40 Bronchitis, not specified as acute or chronic: Secondary | ICD-10-CM

## 2024-05-29 DIAGNOSIS — Z6836 Body mass index (BMI) 36.0-36.9, adult: Secondary | ICD-10-CM

## 2024-05-29 DIAGNOSIS — E559 Vitamin D deficiency, unspecified: Secondary | ICD-10-CM

## 2024-05-29 DIAGNOSIS — E669 Obesity, unspecified: Secondary | ICD-10-CM

## 2024-05-29 NOTE — Progress Notes (Signed)
 WEIGHT SUMMARY AND BIOMETRICS  Vitals Temp: 98.4 F (36.9 C) BP: (!) 143/85 Pulse Rate: 66 SpO2: 98 %   Anthropometric Measurements Height: 5' 4 (1.626 m) Weight: 214 lb (97.1 kg) BMI (Calculated): 36.72 Weight at Last Visit: 211 lb Weight Lost Since Last Visit: 0 Weight Gained Since Last Visit: 3 lb Starting Weight: 214 lb Total Weight Loss (lbs): 0 lb (0 kg) Peak Weight: 220 lb   Body Composition  Body Fat %: 42.4 % Fat Mass (lbs): 90.8 lbs Muscle Mass (lbs): 117 lbs Total Body Water (lbs): 77 lbs Visceral Fat Rating : 11   Other Clinical Data Fasting: no Labs: no Today's Visit #: 3 Starting Date: 04/13/24    Chief Complaint:   OBESITY Diane Townsend is here to discuss her progress with her obesity treatment plan.  She is on the the Category 1 Plan and states she is following her eating plan approximately 75 % of the time.  She states she is exercising: None- recently acutely ill with Bronchitis  Interim History:  She works for [employer]  04/18/2024 Last gov't issued check. She lives alone.  Interval Weight Loss Goal: < 190 lbs Current Weight 214 lbs  She endorses increased appetite since starting and completing course of Prednisone  for acute vial bronchitis.  Subjective:   1. Bronchitis 05/22/2024 MyChart Video Visit OV Notes We are sorry that you are not feeling well.  Here is how we plan to help!   Based on your presentation I believe you most likely have A cough due to a virus.  This is called viral bronchitis and is best treated by rest, plenty of fluids and control of the cough.  You may use Ibuprofen or Tylenol as directed to help your symptoms.   I have also prescribed a short course of steroids. I also prescribed a prescription strength cough syrup.     She has completed course of Prednisone  and Cough Syrup She reports lingering non productive cough and fatigue  2. Essential hypertension- not at goal Ms. Herst reports home  typical readings: SBP: 120-130 DBP: 70s Her BP is elevated at OV, likely r/t to acute illness and course of prednisone  She denies CP or dyspnea  She is on  amLODipine  (NORVASC ) 10 MG tablet   3. Vitamin D  deficiency  Latest Reference Range & Units 04/13/24 09:41  Vitamin D , 25-Hydroxy 30.0 - 100.0 ng/mL 77.0   She recently adjusted OTC Vit D 3 5000 international units from daily to [facility]- Friday only.  4. Prediabetes Lab Results  Component Value Date   HGBA1C 6.2 (H) 04/13/2024   HGBA1C 5.9 06/07/2023   HGBA1C 5.7 06/03/2022    Pertinent family hx: Paternal Grandmother and her father both diabetic She is not currently on any antidiabetic medications  Assessment/Plan:   1. Bronchitis (Primary) Remain well hydrated Rest Frequently Advance activity as tolerated  2. Essential hypertension- not at goal Increase plain water and continue healthy eating Advance activity as tolerated  3. Vitamin D  deficiency Continue  Cholecalciferol (DIALYVITE VITAMIN D  5000) 125 MCG (5000 UT) capsule Take 5,000 Units by mouth daily. Take 5,000IU M-F, off Sat and Sun   4. Prediabetes Consider AOMs at next Lab Check  5. BMI 36.0-36.9,adult, CUURENT BMI 36.7  Lashanda is currently in the action stage of change. As such, her goal is to continue with weight loss efforts. She has agreed to the Category 1 Plan.   Exercise goals: All adults should avoid inactivity. Some physical  activity is better than none, and adults who participate in any amount of physical activity gain some health benefits. Adults should also include muscle-strengthening activities that involve all major muscle groups on 2 or more days a week.  Behavioral modification strategies: increasing lean protein intake, decreasing simple carbohydrates, increasing vegetables, increasing water intake, no skipping meals, meal planning and cooking strategies, keeping healthy foods in the home, ways to avoid boredom eating, and planning for  success.  Cina has agreed to follow-up with our clinic in 4 weeks. She was informed of the importance of frequent follow-up visits to maximize her success with intensive lifestyle modifications for her multiple health conditions.   Objective:   Blood pressure (!) 143/85, pulse 66, temperature 98.4 F (36.9 C), height 5' 4 (1.626 m), weight 214 lb (97.1 kg), SpO2 98%. Body mass index is 36.73 kg/m.  General: Cooperative, alert, well developed, in no acute distress. HEENT: Conjunctivae and lids unremarkable. Cardiovascular: Regular rhythm.  Lungs: Normal work of breathing. Neurologic: No focal deficits.   Lab Results  Component Value Date   CREATININE 0.82 04/13/2024   BUN 10 04/13/2024   NA 140 04/13/2024   K 4.3 04/13/2024   CL 100 04/13/2024   CO2 22 04/13/2024   Lab Results  Component Value Date   ALT 40 (H) 04/13/2024   AST 28 04/13/2024   ALKPHOS 60 04/13/2024   BILITOT 0.3 04/13/2024   Lab Results  Component Value Date   HGBA1C 6.2 (H) 04/13/2024   HGBA1C 5.9 06/07/2023   HGBA1C 5.7 06/03/2022   HGBA1C 6.1 06/02/2021   HGBA1C 5.9 05/22/2020   Lab Results  Component Value Date   INSULIN  43.8 (H) 04/13/2024   Lab Results  Component Value Date   TSH 3.330 04/13/2024   Lab Results  Component Value Date   CHOL 168 04/13/2024   HDL 52 04/13/2024   LDLCALC 87 04/13/2024   LDLDIRECT 74.0 06/03/2022   TRIG 168 (H) 04/13/2024   CHOLHDL 3.2 04/13/2024   Lab Results  Component Value Date   VD25OH 77.0 04/13/2024   VD25OH 36.9 08/26/2017   Lab Results  Component Value Date   WBC 5.7 04/13/2024   HGB 13.7 04/13/2024   HCT 42.4 04/13/2024   MCV 84 04/13/2024   PLT 311 04/13/2024   No results found for: IRON, TIBC, FERRITIN  Attestation Statements:   Reviewed by clinician on day of visit: allergies, medications, problem list, medical history, surgical history, family history, social history, and previous encounter notes.  Time spent on visit  including pre-visit chart review and post-visit care and charting was 26 minutes.   I have reviewed the above documentation for accuracy and completeness, and I agree with the above. -  Yvone Slape d. Kamylle Axelson, NP-C

## 2024-06-08 ENCOUNTER — Other Ambulatory Visit: Payer: Self-pay | Admitting: Primary Care

## 2024-06-08 NOTE — Telephone Encounter (Signed)
 Copied from CRM #[id number]. Topic: Clinical - Medication Refill >> Jun 08, 2024  9:33 AM Rozanna MATSU wrote: Medication: Armodafinil  150 MG tablet  Has the patient contacted their pharmacy? Yes (Agent: If no, request that the patient contact the pharmacy for the refill. If patient does not wish to contact the pharmacy document the reason why and proceed with request.) (Agent: If yes, when and what did the pharmacy advise?)  This is the patient's preferred pharmacy:    [id number] - [place], [place] - [address] AT [facility] OF [address] [facility] [address] [place] [address] Phone: [phone] Fax: [phone]  Is this the correct pharmacy for this prescription? Yes If no, delete pharmacy and type the correct one.   Has the prescription been filled recently? Yes  Is the patient out of the medication? Yes  Has the patient been seen for an appointment in the last year OR does the patient have an upcoming appointment? Yes  Can we respond through MyChart? No  Agent: Please be advised that Rx refills may take up to 3 business days. We ask that you follow-up with your pharmacy.

## 2024-06-09 ENCOUNTER — Telehealth: Payer: Self-pay | Admitting: Primary Care

## 2024-06-09 ENCOUNTER — Other Ambulatory Visit: Payer: Self-pay | Admitting: Primary Care

## 2024-06-09 DIAGNOSIS — G4733 Obstructive sleep apnea (adult) (pediatric): Secondary | ICD-10-CM | POA: Diagnosis not present

## 2024-06-09 MED ORDER — ARMODAFINIL 150 MG PO TABS: ORAL_TABLET | ORAL | 2 refills | Status: DC

## 2024-06-09 NOTE — Telephone Encounter (Signed)
 Copied from CRM [id number]. Topic: General - Other >> Jun 09, 2024 11:22 AM Corean SAUNDERS wrote: Reason for CRM: Patient is inquiring about Zepbound refills from [facility]. Please call patient back to advise.

## 2024-06-09 NOTE — Telephone Encounter (Signed)
 Pt needs to have follow up appt to continue to receive refills, she can follow up with [facility] in [place] as this is her location now or establish care with Dr Catherine in [place] when he is at the office here.  Please call and inform, ask and schedule

## 2024-06-09 NOTE — Telephone Encounter (Signed)
 Pt/pharm requesting refill for Armodafinil  150 MG tablet   Please advise

## 2024-06-09 NOTE — Telephone Encounter (Signed)
 Ill refill but she will need to establish with a new sleep doctor in [place] or come to [place] for further management

## 2024-06-09 NOTE — Telephone Encounter (Unsigned)
 Copied from CRM [id number]. Topic: Clinical - Medication Refill >> Jun 09, 2024 11:20 AM Corean SAUNDERS wrote: Medication: Armodafinil  150 MG tablet [[id number]]  O  Has the patient contacted their pharmacy? Yes, needs approval from [employer] (Agent: If no, request that the patient contact the pharmacy for the refill. If patient does not wish to contact the pharmacy document the reason why and proceed with request.) (Agent: If yes, when and what did the pharmacy advise?)  This is the patient's preferred pharmacy:  [id number] [place], [place] - [address] AT [facility] OF [address] [place] [address] Phone: [phone] Fax: [phone]    Is this the correct pharmacy for this prescription? Yes If no, delete pharmacy and type the correct one.   Has the prescription been filled recently? Yes  Is the patient out of the medication? Yes  Has the patient been seen for an appointment in the last year OR does the patient have an upcoming appointment? Yes  Can we respond through MyChart? No  Agent: Please be advised that Rx refills may take up to 3 business days. We ask that you follow-up with your pharmacy.

## 2024-06-09 NOTE — Telephone Encounter (Signed)
 I do not see this in the pt's chart. This was just denied by insurance in July 2025.

## 2024-06-12 NOTE — Telephone Encounter (Signed)
 Called patient to schedule her a follow up appointment with [facility] and patient asked about medication Zepbound. I did let her know that her insurance denied it back in July, so she asked if she could get some samples instead, please advise and give patient a call back.

## 2024-06-12 NOTE — Telephone Encounter (Signed)
 She's overdue for a follow-up CPAP compliance which was newly started back in July We can discuss samples at her visit, we would only be able to get one month sample at 2.5mg  dose Can we set this aside for her or ask rep for more if needed

## 2024-06-12 NOTE — Telephone Encounter (Signed)
 I called and spoke to pt. Pt informed of Beth's note and verbalized understanding. Pt will keep january appt. NFN

## 2024-06-27 ENCOUNTER — Ambulatory Visit (INDEPENDENT_AMBULATORY_CARE_PROVIDER_SITE_OTHER): Admitting: Adult Health

## 2024-06-27 ENCOUNTER — Encounter (INDEPENDENT_AMBULATORY_CARE_PROVIDER_SITE_OTHER): Payer: Self-pay | Admitting: Adult Health

## 2024-06-27 VITALS — BP 139/81 | HR 81 | Temp 98.5°F | Ht 64.0 in | Wt 213.0 lb

## 2024-06-27 DIAGNOSIS — Z6836 Body mass index (BMI) 36.0-36.9, adult: Secondary | ICD-10-CM

## 2024-06-27 DIAGNOSIS — E559 Vitamin D deficiency, unspecified: Secondary | ICD-10-CM

## 2024-06-27 DIAGNOSIS — I10 Essential (primary) hypertension: Secondary | ICD-10-CM | POA: Diagnosis not present

## 2024-06-27 DIAGNOSIS — E781 Pure hyperglyceridemia: Secondary | ICD-10-CM

## 2024-06-27 DIAGNOSIS — K760 Fatty (change of) liver, not elsewhere classified: Secondary | ICD-10-CM | POA: Diagnosis not present

## 2024-06-27 NOTE — Progress Notes (Signed)
 WEIGHT SUMMARY AND BIOMETRICS  Vitals Temp: 98.5 F (36.9 C) BP: 139/81 Pulse Rate: 81 SpO2: 98 %   Anthropometric Measurements Height: 5' 4 (1.626 m) Weight: 213 lb (96.6 kg) BMI (Calculated): 36.54 Weight at Last Visit: 214 lb Weight Lost Since Last Visit: 1 lb Weight Gained Since Last Visit: 0 Starting Weight: 214 lb Total Weight Loss (lbs): 1 lb (0.454 kg) Peak Weight: 220 lb   Body Composition  Body Fat %: 42.7 % Fat Mass (lbs): 91.2 lbs Muscle Mass (lbs): 116.4 lbs Total Body Water (lbs): 77 lbs Visceral Fat Rating : 11   Other Clinical Data Fasting: no Labs: no Today's Visit #: 4 Starting Date: 04/13/24    Chief Complaint:   OBESITY Diane Townsend is here to discuss her progress with her obesity treatment plan.  She is on the the Category 1 Plan and states she is following her eating plan approximately 50 % of the time.  She states she is exercising Walking/Elliptical 60 minutes 3 times per week.  Interim History:  She happily travelled home to [place]  for the week of Thanksgiving. She was surrounded by family and friends. [place]. Her husband, Diane Townsend, passed away 09-30-21- her was 48 years old.  Diane Townsend will celebrate her 48th birthday July 2026- in [place]  with her support system!  Subjective:   1. Essential hypertension- not at goal BP still above goal at OV She denies CP with exertion  2. Vitamin D  deficiency  Latest Reference Range & Units 04/13/24 09:41  Vitamin D , 25-Hydroxy 30.0 - 100.0 ng/mL 77.0   She is on  Cholecalciferol (DIALYVITE VITAMIN D  5000) 125 MCG (5000 UT) capsule Take 5,000 Units by mouth daily. Take 5,000IU M-F, off Sat and Sun   3. Hypertriglyceridemia  Latest Reference Range & Units 06/07/23 16:26 04/13/24 09:41  Triglycerides 0 - 149 mg/dL 760.9 (H) 831 (H)  (H): Data is abnormally high  TGs still elevated, however great reduction from last reading.  4. Metabolic  dysfunction-associated steatotic liver disease (MASLD) 04/04/2022 CLINICAL DATA:  Abdominal pain and vomiting for 1 day   EXAM: CT ABDOMEN AND PELVIS WITH CONTRAST   TECHNIQUE: Multidetector CT imaging of the abdomen and pelvis was performed using the standard protocol following bolus administration of intravenous contrast.   RADIATION DOSE REDUCTION: This exam was performed according to the departmental dose-optimization program which includes automated exposure control, adjustment of the mA and/or kV according to patient size and/or use of iterative reconstruction technique.   CONTRAST:  OMNIPAQUE  IOHEXOL  300 MG/ML  SOLN   COMPARISON:  None Available.   FINDINGS: Lower chest: No acute abnormality.   Hepatobiliary: No focal liver abnormality is seen. Status post cholecystectomy. No biliary dilatation.   Pancreas: Unremarkable. No pancreatic ductal dilatation or surrounding inflammatory changes.   Spleen: Normal in size without focal abnormality.   Adrenals/Urinary Tract: Adrenal glands are within normal limits. Kidneys demonstrate a normal enhancement pattern bilaterally. Scattered small hypodense lesions are noted measuring 1 cm and less consistent with cysts. No further follow-up is recommended. The bladder is decompressed.   Stomach/Bowel: Colon shows evidence of diverticulitis in the mid descending colon. No perforation or abscess formation is noted. More proximal colon is within normal limits. The appendix has been surgically removed. Small bowel and stomach are within normal limits.   Vascular/Lymphatic: No significant vascular findings are present. No enlarged abdominal or pelvic lymph nodes.   Reproductive: Status post hysterectomy. No  adnexal masses.   Other: No abdominal wall hernia or abnormality. No abdominopelvic ascites.   Musculoskeletal: No acute or significant osseous findings.   IMPRESSION: Changes of diverticulitis in the mid descending  colon. No abscess or perforation is noted.    Latest Reference Range & Units 04/13/24 09:41  Alkaline Phosphatase 41 - 116 IU/L 60  Albumin 3.9 - 4.9 g/dL 4.5  AST 0 - 40 IU/L 28  ALT 0 - 32 IU/L 40 (H)  (H): Data is abnormally high  Assessment/Plan:   1. Essential hypertension- not at goal (Primary) Limit Na+ intake Continue regular cardiovascular exercise  2. Vitamin D  deficiency Continue current supplementation and monitor labs  3. Hypertriglyceridemia Continue healthy eating and regular cardiovascular exercise  4. Metabolic dysfunction-associated steatotic liver disease (MASLD) Continue healthy eating and regular cardiovascular exercise  5. BMI 36.0-36.9,adult, CUURENT BMI 36.7  Diane Townsend is currently in the action stage of change. As such, her goal is to continue with weight loss efforts. She has agreed to the Category 1 Plan.   Exercise goals: For substantial health benefits, adults should do at least 150 minutes (2 hours and 30 minutes) a week of moderate-intensity, or 75 minutes (1 hour and 15 minutes) a week of vigorous-intensity aerobic physical activity, or an equivalent combination of moderate- and vigorous-intensity aerobic activity. Aerobic activity should be performed in episodes of at least 10 minutes, and preferably, it should be spread throughout the week.  Behavioral modification strategies: increasing lean protein intake, decreasing simple carbohydrates, increasing vegetables, increasing water intake, no skipping meals, meal planning and cooking strategies, keeping healthy foods in the home, ways to avoid boredom eating, and planning for success.  Diane Townsend has agreed to follow-up with our clinic in 4 weeks. She was informed of the importance of frequent follow-up visits to maximize her success with intensive lifestyle modifications for her multiple health conditions.   Objective:   Blood pressure 139/81, pulse 81, temperature 98.5 F (36.9 C), height 5' 4 (1.626  m), weight 213 lb (96.6 kg), SpO2 98%. Body mass index is 36.56 kg/m.  General: Cooperative, alert, well developed, in no acute distress. HEENT: Conjunctivae and lids unremarkable. Cardiovascular: Regular rhythm.  Lungs: Normal work of breathing. Neurologic: No focal deficits.   Lab Results  Component Value Date   CREATININE 0.82 04/13/2024   BUN 10 04/13/2024   NA 140 04/13/2024   K 4.3 04/13/2024   CL 100 04/13/2024   CO2 22 04/13/2024   Lab Results  Component Value Date   ALT 40 (H) 04/13/2024   AST 28 04/13/2024   ALKPHOS 60 04/13/2024   BILITOT 0.3 04/13/2024   Lab Results  Component Value Date   HGBA1C 6.2 (H) 04/13/2024   HGBA1C 5.9 06/07/2023   HGBA1C 5.7 06/03/2022   HGBA1C 6.1 06/02/2021   HGBA1C 5.9 05/22/2020   Lab Results  Component Value Date   INSULIN  43.8 (H) 04/13/2024   Lab Results  Component Value Date   TSH 3.330 04/13/2024   Lab Results  Component Value Date   CHOL 168 04/13/2024   HDL 52 04/13/2024   LDLCALC 87 04/13/2024   LDLDIRECT 74.0 06/03/2022   TRIG 168 (H) 04/13/2024   CHOLHDL 3.2 04/13/2024   Lab Results  Component Value Date   VD25OH 77.0 04/13/2024   VD25OH 36.9 08/26/2017   Lab Results  Component Value Date   WBC 5.7 04/13/2024   HGB 13.7 04/13/2024   HCT 42.4 04/13/2024   MCV 84 04/13/2024   PLT 311  04/13/2024   No results found for: IRON, TIBC, FERRITIN  Attestation Statements:   Reviewed by clinician on day of visit: allergies, medications, problem list, medical history, surgical history, family history, social history, and previous encounter notes.  Time spent on visit including pre-visit chart review and post-visit care and charting was 28 minutes.

## 2024-06-29 DIAGNOSIS — G4733 Obstructive sleep apnea (adult) (pediatric): Secondary | ICD-10-CM | POA: Diagnosis not present

## 2024-07-31 ENCOUNTER — Encounter (INDEPENDENT_AMBULATORY_CARE_PROVIDER_SITE_OTHER): Payer: Self-pay | Admitting: Adult Health

## 2024-07-31 ENCOUNTER — Ambulatory Visit (INDEPENDENT_AMBULATORY_CARE_PROVIDER_SITE_OTHER): Admitting: Adult Health

## 2024-07-31 VITALS — BP 138/81 | HR 89 | Temp 98.6°F | Ht 64.0 in | Wt 217.0 lb

## 2024-07-31 DIAGNOSIS — Z6837 Body mass index (BMI) 37.0-37.9, adult: Secondary | ICD-10-CM | POA: Diagnosis not present

## 2024-07-31 DIAGNOSIS — E669 Obesity, unspecified: Secondary | ICD-10-CM | POA: Diagnosis not present

## 2024-07-31 DIAGNOSIS — E781 Pure hyperglyceridemia: Secondary | ICD-10-CM

## 2024-07-31 DIAGNOSIS — E559 Vitamin D deficiency, unspecified: Secondary | ICD-10-CM | POA: Diagnosis not present

## 2024-07-31 DIAGNOSIS — R7303 Prediabetes: Secondary | ICD-10-CM | POA: Diagnosis not present

## 2024-07-31 DIAGNOSIS — K760 Fatty (change of) liver, not elsewhere classified: Secondary | ICD-10-CM

## 2024-07-31 DIAGNOSIS — Z6836 Body mass index (BMI) 36.0-36.9, adult: Secondary | ICD-10-CM

## 2024-07-31 NOTE — Progress Notes (Signed)
 "    WEIGHT SUMMARY AND BIOMETRICS  Vitals Temp: 98.6 F (37 C) BP: 138/81 Pulse Rate: 89 SpO2: 99 %   Anthropometric Measurements Height: 5' 4 (1.626 m) Weight: 217 lb (98.4 kg) BMI (Calculated): 37.23 Weight at Last Visit: 213lb Weight Lost Since Last Visit: 0lb Weight Gained Since Last Visit: 4lb Starting Weight: 214lb Total Weight Loss (lbs): 0 lb (0 kg) Peak Weight: 220lb   Body Composition  Body Fat %: 43.2 % Fat Mass (lbs): 94 lbs Muscle Mass (lbs): 117.2 lbs Total Body Water (lbs): 79 lbs Visceral Fat Rating : 12   Other Clinical Data Fasting: yes Labs: no Today's Visit #: 5 Starting Date: 04/13/24    Chief Complaint:   OBESITY Diane Townsend is here to discuss her progress with her obesity treatment plan.  She is on the the Category 1 Plan and states she is following her eating plan approximately 50 % of the time.  She states she is exercising Walking 30 minutes 3 times per week.  Interim History:  She travelled to [place], [place]. with her extended family Dec 28- Jan 2  She presents fasting for labs to be completed.  She wants to increase eating on plan in the New Year.  Subjective:   1. Vitamin D  deficiency  Latest Reference Range & Units 04/13/24 09:41  Vitamin D , 25-Hydroxy 30.0 - 100.0 ng/mL 77.0   She is on Vit D3 5000 international units : takes Monday-Friday  2. Hypertriglyceridemia Lipid Panel     Component Value Date/Time   CHOL 168 04/13/2024 0941   TRIG 168 (H) 04/13/2024 0941   HDL 52 04/13/2024 0941   CHOLHDL 3.2 04/13/2024 0941   CHOLHDL 4 06/07/2023 1626   VLDL 47.8 (H) 06/07/2023 1626   LDLCALC 87 04/13/2024 0941   LDLDIRECT 74.0 06/03/2022 1643   LABVLDL 29 04/13/2024 0941    She denies CP with exertion She has been increasing duration when walking  3. Metabolic dysfunction-associated steatotic liver disease (MASLD) She denies RUQ pain  4. Prediabetes Her paternal grandmother had T2D Her father developed diabetes  at age 53 She has never been on any antidiabetic medications   Assessment/Plan:   1. Vitamin D  deficiency (Primary) Check Labs - VITAMIN D  25 Hydroxy (Vit-D Deficiency, Fractures)  2. Hypertriglyceridemia Check Labs - Lipid panel  3. Metabolic dysfunction-associated steatotic liver disease (MASLD) Check Labs - Comprehensive metabolic panel with GFR  4. Prediabetes Check Labs - Hemoglobin A1c - Insulin , random  5. BMI 36.0-36.9,adult, CUURENT BMI 37.3  [name] is currently in the action stage of change. As such, her goal is to continue with weight loss efforts. She has agreed to the Category 1 Plan.   Exercise goals: For substantial health benefits, adults should do at least 150 minutes (2 hours and 30 minutes) a week of moderate-intensity, or 75 minutes (1 hour and 15 minutes) a week of vigorous-intensity aerobic physical activity, or an equivalent combination of moderate- and vigorous-intensity aerobic activity. Aerobic activity should be performed in episodes of at least 10 minutes, and preferably, it should be spread throughout the week.  Behavioral modification strategies: increasing lean protein intake, decreasing simple carbohydrates, increasing vegetables, increasing water intake, meal planning and cooking strategies, keeping healthy foods in the home, ways to avoid boredom eating, better snacking choices, and planning for success.  [name] has agreed to follow-up with our clinic in 4 weeks. She was informed of the importance of frequent follow-up visits to maximize her success with intensive lifestyle modifications for  her multiple health conditions.   [name] was informed we would discuss her lab results at her next visit unless there is a critical issue that needs to be addressed sooner. [name] agreed to keep her next visit at the agreed upon time to discuss these results.  Objective:   Blood pressure 138/81, pulse 89, temperature 98.6 F (37 C), height 5' 4 (1.626 m),  weight 217 lb (98.4 kg), SpO2 99%. Body mass index is 37.25 kg/m.  General: Cooperative, alert, well developed, in no acute distress. HEENT: Conjunctivae and lids unremarkable. Cardiovascular: Regular rhythm.  Lungs: Normal work of breathing. Neurologic: No focal deficits.   Lab Results  Component Value Date   CREATININE 0.82 04/13/2024   BUN 10 04/13/2024   NA 140 04/13/2024   K 4.3 04/13/2024   CL 100 04/13/2024   CO2 22 04/13/2024   Lab Results  Component Value Date   ALT 40 (H) 04/13/2024   AST 28 04/13/2024   ALKPHOS 60 04/13/2024   BILITOT 0.3 04/13/2024   Lab Results  Component Value Date   HGBA1C 6.2 (H) 04/13/2024   HGBA1C 5.9 06/07/2023   HGBA1C 5.7 06/03/2022   HGBA1C 6.1 06/02/2021   HGBA1C 5.9 05/22/2020   Lab Results  Component Value Date   INSULIN  43.8 (H) 04/13/2024   Lab Results  Component Value Date   TSH 3.330 04/13/2024   Lab Results  Component Value Date   CHOL 168 04/13/2024   HDL 52 04/13/2024   LDLCALC 87 04/13/2024   LDLDIRECT 74.0 06/03/2022   TRIG 168 (H) 04/13/2024   CHOLHDL 3.2 04/13/2024   Lab Results  Component Value Date   VD25OH 77.0 04/13/2024   VD25OH 36.9 08/26/2017   Lab Results  Component Value Date   WBC 5.7 04/13/2024   HGB 13.7 04/13/2024   HCT 42.4 04/13/2024   MCV 84 04/13/2024   PLT 311 04/13/2024   No results found for: IRON, TIBC, FERRITIN  Attestation Statements:   Reviewed by clinician on day of visit: allergies, medications, problem list, medical history, surgical history, family history, social history, and previous encounter notes.  I have reviewed the above documentation for accuracy and completeness, and I agree with the above. -  Nia Nathaniel d. Macguire Holsinger, NP-C "

## 2024-08-01 ENCOUNTER — Encounter (INDEPENDENT_AMBULATORY_CARE_PROVIDER_SITE_OTHER): Payer: Self-pay | Admitting: Adult Health

## 2024-08-01 LAB — LIPID PANEL
Chol/HDL Ratio: 3.4 ratio (ref 0.0–4.4)
Cholesterol, Total: 149 mg/dL (ref 100–199)
HDL: 44 mg/dL
LDL Chol Calc (NIH): 76 mg/dL (ref 0–99)
Triglycerides: 173 mg/dL — ABNORMAL HIGH (ref 0–149)
VLDL Cholesterol Cal: 29 mg/dL (ref 5–40)

## 2024-08-01 LAB — COMPREHENSIVE METABOLIC PANEL WITH GFR
ALT: 37 IU/L — ABNORMAL HIGH (ref 0–32)
AST: 26 IU/L (ref 0–40)
Albumin: 4.7 g/dL (ref 3.9–4.9)
Alkaline Phosphatase: 62 IU/L (ref 41–116)
BUN/Creatinine Ratio: 15 (ref 9–23)
BUN: 14 mg/dL (ref 6–24)
Bilirubin Total: 0.5 mg/dL (ref 0.0–1.2)
CO2: 22 mmol/L (ref 20–29)
Calcium: 9.8 mg/dL (ref 8.7–10.2)
Chloride: 103 mmol/L (ref 96–106)
Creatinine, Ser: 0.91 mg/dL (ref 0.57–1.00)
Globulin, Total: 3.1 g/dL (ref 1.5–4.5)
Glucose: 106 mg/dL — ABNORMAL HIGH (ref 70–99)
Potassium: 4.4 mmol/L (ref 3.5–5.2)
Sodium: 140 mmol/L (ref 134–144)
Total Protein: 7.8 g/dL (ref 6.0–8.5)
eGFR: 77 mL/min/1.73

## 2024-08-01 LAB — HEMOGLOBIN A1C
Est. average glucose Bld gHb Est-mCnc: 120 mg/dL
Hgb A1c MFr Bld: 5.8 % — ABNORMAL HIGH (ref 4.8–5.6)

## 2024-08-01 LAB — VITAMIN D 25 HYDROXY (VIT D DEFICIENCY, FRACTURES): Vit D, 25-Hydroxy: 67.8 ng/mL (ref 30.0–100.0)

## 2024-08-01 LAB — INSULIN, RANDOM: INSULIN: 44.7 u[IU]/mL — ABNORMAL HIGH (ref 2.6–24.9)

## 2024-08-08 ENCOUNTER — Encounter: Payer: Self-pay | Admitting: Primary Care

## 2024-08-08 ENCOUNTER — Ambulatory Visit: Admitting: Primary Care

## 2024-08-08 VITALS — BP 145/93 | HR 91 | Temp 97.2°F | Ht 64.0 in | Wt 219.2 lb

## 2024-08-08 DIAGNOSIS — Z6837 Body mass index (BMI) 37.0-37.9, adult: Secondary | ICD-10-CM | POA: Diagnosis not present

## 2024-08-08 DIAGNOSIS — G4733 Obstructive sleep apnea (adult) (pediatric): Secondary | ICD-10-CM

## 2024-08-08 DIAGNOSIS — E669 Obesity, unspecified: Secondary | ICD-10-CM

## 2024-08-08 DIAGNOSIS — G4710 Hypersomnia, unspecified: Secondary | ICD-10-CM

## 2024-08-08 DIAGNOSIS — E6609 Other obesity due to excess calories: Secondary | ICD-10-CM

## 2024-08-08 MED ORDER — ZEPBOUND 2.5 MG/0.5ML ~~LOC~~ SOAJ: 2.5000 mg | SUBCUTANEOUS | Status: AC

## 2024-08-08 MED ORDER — ZEPBOUND 2.5 MG/0.5ML ~~LOC~~ SOAJ: 2.5000 mg | SUBCUTANEOUS | 1 refills | Status: AC

## 2024-08-08 MED ORDER — ARMODAFINIL 150 MG PO TABS: ORAL_TABLET | ORAL | 2 refills | Status: AC

## 2024-08-08 NOTE — Patient Instructions (Addendum)
" °  VISIT SUMMARY: During your follow-up visit, we discussed your sleep apnea and weight management. Your sleep apnea is well-controlled with your CPAP machine, and you are using it consistently. We also talked about your recent weight gain and its potential impact on your sleep apnea and blood pressure.  YOUR PLAN: -OBSTRUCTIVE SLEEP APNEA: Obstructive sleep apnea is a condition where your breathing stops and starts repeatedly during sleep due to blocked upper airways. Your sleep apnea is well-controlled with your CPAP machine set at a pressure of 13 cm H2O. Your current apnea-hypopnea index (AHI) is 3.2, which is below the threshold of 5 for well-controlled sleep apnea. Continue using your CPAP machine as directed and monitor for any symptoms such as snoring, choking, or gasping, which may require an adjustment in your CPAP pressure.  -WEIGHT MANAGEMENT: Weight management is important for overall health and can impact conditions like sleep apnea and high blood pressure. Despite working with a nutritionist and initially losing weight, you have experienced some weight regain. We discussed the importance of continuing to work on weight management to potentially improve your sleep apnea control and overall health. Consider discussing alternative weight loss strategies with your healthcare provider if needed.  INSTRUCTIONS: Continue using your CPAP machine at the current pressure setting of 13 cm H2O. Monitor for any symptoms such as snoring, choking, or gasping. Continue working with your nutritionist on weight management and consider discussing alternative weight loss strategies with your healthcare provider if needed.  Follow-up 4-6 week virtual or in person visit with [facility] NP      Notify your provider if you are planning to have a procedure/surgery, as this medication will need to be stopped prior.  Contains text generated by [employer].   "

## 2024-08-08 NOTE — Progress Notes (Signed)
 "  @Patient  ID: Russell Pan, female    DOB: 03/04/1976, 49 y.o.   MRN: [id number]  Chief Complaint  Patient presents with   Follow-up    Referring provider: [name] Lynwood HERO, NP  HPI: 49 year old female, never smoked.  Past medical history significant for OSA, hypertension, obesity.  Patient of Dr. Shellia.   Previous [facility] pulmonary encounter: 12/27/2020 Patient presents today for 1 year follow-up OSA/daytime sleepiness.  She is doing very well. No acute complaints. No issues with pressure setting or mask fit. She is 100% compliant with CPAP, using on average 8 hours and 42 minutes a night.  CPAP pressure 13 cm H2O, residual AHI 2.3. She takes Armodafinil  150mg  daily. Epworth score 3/40. She is going through some adjustments at home, her husband was recently placed in Skilled nursing facility d/t his past stroke.   Airview download 11/25/20-12/24/20: 30/30 days (100%); 30 days (100%) > 4 hours Average usage 8 hours 42 mins Pressure 13 cm h20 Airleaks 2.9L/min (95%) AHI 2.3   12/29/2021 Patient presents today for a 1 year follow-up OSA.  She is 100% compliant with CPAP use from 11/26/2021 - 12/25/2021.  Current pressure settings 13 cm H2O with residual AHI 1.7/hr. No issues sleeping at night. She gets 8 hours of sleep a night. Her husband past in February, she was her main care taker. She is maintained on Armodafinil  (Nuvigil ) 150mg  daily for residual daytime sleepiness in patient with OSA.  She is more alert during the daytime when taking medication. No residual daytime sleepiness. Epworth score 2/24.  Airview download 11/26/2021-12/25/2021 Usage 30/30 days; 100% greater than 4 hours Average usage 8 hours 37 minutes Pressure 13 cm H2O Air leaks 8.6 L/min (95%) AHI 1.7  12/21/2022 Patient presents today for annual OSA follow-up.  Patient is doing well today without acute complaints.  She is 100% compliant with CPAP and reports benefit from use. Sleeping well at night.  Average usage 9 hours a night.   Current CPAP pressure 13 cm H2O.  She will be eligible for a new CPAP in November 2024. No issues with mask fit or pressure settings.  She takes Nuvigil  150 mg daily in the morning.  She has no residual daytime sleepiness or sudden sleep attacks.  Her blood pressure slightly elevated today at 140/90, she did not take Norvasc  today.  She states she does not take blood pressure medication every day.  She is working on weight loss efforts, her weight is down 5 to 10 pounds since last year.    Airview download 11/18/22-12/17/22 30/30 days (100%) greater than 4 hours Average usage 9 hours 3 minutes Pressure 13 cm H2O Air leaks 5.4L/min AHI 2.2    02/09/2024 Discussed the use of AI scribe software for clinical note transcription with the patient, who gave verbal consent to proceed.  History of Present Illness   Panayiota Larkin is a 49 year old female with moderate obstructive sleep apnea who presents for a one-year follow-up.  She has a history of moderate obstructive sleep apnea diagnosed in 2009 and has been using CPAP therapy for over 15 years. She underwent a titration study in 2019. She uses her CPAP machine consistently for eight hours and seventeen minutes per night with a pressure setting of 13. Her residual apnea score is 2.3 with no significant air leaks. She experiences gasping if she dozes off without the CPAP.  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 She  now goes to bed around 8 or 9 PM, whereas previously it was 10 or 11 PM. She experiences anxiety related to her 45-minute commute, impacting her sleep. She has difficulty falling asleep and waking up early, which has been an issue since returning to the office two months ago. She has not taken any medication for insomnia and is not currently interested in doing so.  She has a history of IBS-C and is working on reasonable accommodation paperwork to work from home due to her IBS.  She has not been on any weight loss medications recently due to cost issues but previously used Wegovy , which helped her manage her weight. She has been less active since her husband's passing two years ago, contributing to weight gain. She tries to stay active by walking.    Airview download 01/09/24-02/07/24 Usage days 30/30 days (100%) >  4 hours Average 8 hours 17 mins Pressure 13cm h20 Airleaks 1.5 AHI 2.3   08/08/2024 Discussed the use of AI scribe software for clinical note transcription with the patient, who gave verbal consent to proceed.  History of Present Illness Sherl Yzaguirre is a 49 year old female with sleep apnea who presents for a follow-up visit.  She is using a CPAP machine with a pressure setting of 13, maintaining 100% compliance with an average use of nine hours and seven minutes per night. Her apnea score is 3.2. No symptoms of waking up choking or gasping.  She has experienced weight gain, which she acknowledges could affect her sleep apnea. Despite working with a nutritionist since September and initially losing weight, she has regained weight despite ongoing monthly visits. She has a history of high blood pressure, which she believes may be related to her weight. In the past, she has tried phentermine for weight loss, which was discontinued due to its impact on her blood pressure. She is pre-diabetic, and she has previously been on Wegovy  for weight loss until the cost became prohibitive.  No history of thyroid  cancer and no family history of thyroid  cancer.  Airview download 07/08/24-08/07/23 Usage days 30/30 days Average usage 9 hours 7 minutes Pressure 13cm h20 Airleaks 4.4L/min (95%) AHI 3.2   Allergies[1]  Immunization History  Administered Date(s) Administered   Influenza Inj Mdck Quad With Preservative 05/29/2019   Influenza, Seasonal, Injecte, Preservative Fre 06/07/2023   Influenza,inj,Quad PF,6+ Mos 05/23/2018, 05/29/2020, 06/02/2021, 06/03/2022    Influenza,inj,quad, With Preservative 05/28/2016   PFIZER(Purple Top)SARS-COV-2 Vaccination 10/27/2019, 11/16/2019   Pneumococcal Polysaccharide-23 07/20/2008   Tdap 05/23/2018    Past Medical History:  Diagnosis Date   Chronic fatigue syndrome    Constipation    Diverticulitis    Gallbladder disorder    GERD (gastroesophageal reflux disease)    Hypertensive disorder 05/09/2015   IBS (irritable bowel syndrome)    Impacted cerumen    Obesity    Obstructive sleep apnea syndrome 04/26/2014   Pre-diabetes    Vitamin D  deficiency     Tobacco History: Tobacco Use History[2] Counseling given: Not Answered   Outpatient Medications Prior to Visit  Medication Sig Dispense Refill   amLODipine  (NORVASC ) 10 MG tablet TAKE 1 TABLET(10 MG) BY MOUTH DAILY 90 tablet 1   Armodafinil  150 MG tablet TAKE 1 TABLET BY MOUTH DAILY AT 9 AM 30 tablet 2   Cholecalciferol (DIALYVITE VITAMIN D  5000) 125 MCG (5000 UT) capsule Take 5,000 Units by mouth daily. Take 5,000IU M-F, off Sat and Sun     folic acid  (FOLVITE ) 800 MCG tablet Take  1 tablet (800 mcg total) by mouth daily.     LINZESS  145 MCG CAPS capsule TAKE 1 CAPSULE(145 MCG) BY MOUTH DAILY BEFORE BREAKFAST 30 capsule 5   Multiple Vitamins-Minerals (MULTIVITAMIN ADULT PO) Vitamin-Mineral Supplement oral liquid     No facility-administered medications prior to visit.   Review of Systems  Review of Systems  Constitutional: Negative.   Respiratory: Negative.     Physical Exam  BP (!) 149/81 (BP Location: Left Arm, Patient Position: Sitting, Cuff Size: Large)   Pulse 91   Temp (!) 97.2 F (36.2 C)   Ht 5' 4 (1.626 m)   Wt 219 lb 3.2 oz (99.4 kg)   SpO2 99%   BMI 37.63 kg/m  Physical Exam Constitutional:      Appearance: Normal appearance. She is well-developed.  HENT:     Head: Normocephalic and atraumatic.     Mouth/Throat:     Mouth: Mucous membranes are moist.     Pharynx: Oropharynx is clear.  Eyes:     Pupils: Pupils are  equal, round, and reactive to light.  Cardiovascular:     Rate and Rhythm: Normal rate and regular rhythm.     Heart sounds: Normal heart sounds. No murmur heard. Pulmonary:     Effort: Pulmonary effort is normal. No respiratory distress.     Breath sounds: Normal breath sounds. No wheezing or rhonchi.  Musculoskeletal:        General: Normal range of motion.     Cervical back: Normal range of motion and neck supple.  Skin:    General: Skin is warm and dry.     Findings: No erythema or rash.  Neurological:     General: No focal deficit present.     Mental Status: She is alert and oriented to person, place, and time. Mental status is at baseline.  Psychiatric:        Mood and Affect: Mood normal.        Behavior: Behavior normal.        Thought Content: Thought content normal.        Judgment: Judgment normal.     Lab Results:  CBC    Component Value Date/Time   WBC 5.7 04/13/2024 0941   WBC 6.9 02/18/2024 0941   RBC 5.08 04/13/2024 0941   RBC 4.92 02/18/2024 0941   HGB 13.7 04/13/2024 0941   HCT 42.4 04/13/2024 0941   PLT 311 04/13/2024 0941   MCV 84 04/13/2024 0941   MCH 27.0 04/13/2024 0941   MCH 27.0 04/04/2022 2031   MCHC 32.3 04/13/2024 0941   MCHC 33.0 02/18/2024 0941   RDW 14.2 04/13/2024 0941   LYMPHSABS 2.3 04/13/2024 0941   MONOABS 0.5 02/18/2024 0941   EOSABS 0.2 04/13/2024 0941   BASOSABS 0.0 04/13/2024 0941    BMET    Component Value Date/Time   NA 140 07/31/2024 0905   K 4.4 07/31/2024 0905   CL 103 07/31/2024 0905   CO2 22 07/31/2024 0905   GLUCOSE 106 (H) 07/31/2024 0905   GLUCOSE 96 02/18/2024 0941   BUN 14 07/31/2024 0905   CREATININE 0.91 07/31/2024 0905   CALCIUM 9.8 07/31/2024 0905   GFRNONAA >60 04/04/2022 2031   GFRAA 102 12/01/2019 1459    BNP No results found for: BNP  ProBNP No results found for: PROBNP  Imaging: No results found.   Assessment & Plan:   1. OSA (obstructive sleep apnea) (Primary)  2. Sleep apnea  with hypersomnolence  3. Class  2 obesity due to excess calories without serious comorbidity with body mass index (BMI) of 36.0 to 36.9 in adult   Assessment and Plan Assessment & Plan Obstructive sleep apnea Well-controlled with CPAP therapy. Current pressure settings 13cm h20. Residual apnea-hypopnea index (AHI) is 3.2, below the threshold of 5 for well-controlled sleep apnea. No symptoms of choking, gasping, or snoring reported. Weight gain noted, which could potentially affect sleep apnea control. - Continue CPAP therapy at current pressure setting of 13 cm H2O. - Monitor for symptoms such as snoring, choking, or gasping, which may necessitate an increase in CPAP pressure. - Address weight management to potentially improve sleep apnea control.  Hypersomnia - Stable; Continue Nuvigil  150mg  daily at 6am   Obesity Obesity with a BMI of 37. Previous use of Wegovy  in 2024 which was beneficial but discontinued due to cost. Patient has tried phentermine in the past which was also helpful but medication is contraindicated due to hypertension. Discussed potential for insurance coverage of Zepbound , a newer medication with indications for sleep apnea and obesity. Samples given for 2.5mg  [place] injection weekly.  Following with nutritionist.   Patient has moderate to severe sleep apnea related to obesity with BMI >/30 or is overweight with BMI >/27, posing significant cardiovascular risks. Patient has failed traditional weight loss measures with caloric deficit and consistent exercise of 150 min/week for >/6 months. Patient will be initiated on Zepbound  (tirzepatide ) for weight management. Zepbound  is the only pharmaceutical treatment approved for moderate-to-severe OSA in adults who are overweight (BMI >/27) or obese (BMI >/30). The patient will continue lifestyle modifications, including structured nutrition and physical activity as directed. No other GLP1 therapy will be used simultaneously at this time.  The patient does not have any FDA labeled contraindications to this agent, including pregnancy, lactation, hx or family history of medullary thyroid  cancer, or multiple endocrine neoplasia type II. Side effect profile has been reviewed with patient. Aware of red flag symptoms to notify of immediately or seek emergency care, including severe nausea/vomiting, inability to pass bowels or gas, severe abdominal pain/tenderness, jaundice.    Almarie LELON Ferrari, NP 08/08/2024     [1] No Known Allergies [2]  Social History Tobacco Use  Smoking Status Never  Smokeless Tobacco Never   "

## 2024-08-28 ENCOUNTER — Ambulatory Visit (INDEPENDENT_AMBULATORY_CARE_PROVIDER_SITE_OTHER): Admitting: Adult Health

## 2024-09-08 ENCOUNTER — Ambulatory Visit: Admitting: Primary Care
